# Patient Record
Sex: Male | Born: 1945 | Race: White | Hispanic: No | Smoking: Never smoker
Health system: Southern US, Community
[De-identification: ages and names within clinical notes are randomized; demographics above are authoritative.]

## PROBLEM LIST (undated history)

## (undated) DIAGNOSIS — I10 Essential (primary) hypertension: Secondary | ICD-10-CM

## (undated) DIAGNOSIS — I209 Angina pectoris, unspecified: Secondary | ICD-10-CM

## (undated) DIAGNOSIS — R0602 Shortness of breath: Secondary | ICD-10-CM

## (undated) DIAGNOSIS — Z8249 Family history of ischemic heart disease and other diseases of the circulatory system: Secondary | ICD-10-CM

## (undated) DIAGNOSIS — I252 Old myocardial infarction: Secondary | ICD-10-CM

## (undated) DIAGNOSIS — E781 Pure hyperglyceridemia: Secondary | ICD-10-CM

## (undated) DIAGNOSIS — I2510 Atherosclerotic heart disease of native coronary artery without angina pectoris: Secondary | ICD-10-CM

## (undated) HISTORY — PX: CAROTID ARTERY ANGIOPLASTY: SHX1300

## (undated) HISTORY — PX: CORONARY ANGIOPLASTY WITH STENT PLACEMENT: SHX49

---

## 2011-07-11 ENCOUNTER — Encounter (HOSPITAL_COMMUNITY): Payer: Self-pay | Admitting: Pharmacy Technician

## 2011-07-18 ENCOUNTER — Encounter (HOSPITAL_COMMUNITY): Payer: Self-pay | Admitting: Cardiology

## 2011-07-18 DIAGNOSIS — R9431 Abnormal electrocardiogram [ECG] [EKG]: Secondary | ICD-10-CM | POA: Diagnosis present

## 2011-07-18 DIAGNOSIS — I10 Essential (primary) hypertension: Secondary | ICD-10-CM | POA: Diagnosis present

## 2011-07-18 DIAGNOSIS — E781 Pure hyperglyceridemia: Secondary | ICD-10-CM

## 2011-07-18 DIAGNOSIS — I209 Angina pectoris, unspecified: Secondary | ICD-10-CM | POA: Diagnosis present

## 2011-07-18 DIAGNOSIS — Z8249 Family history of ischemic heart disease and other diseases of the circulatory system: Secondary | ICD-10-CM

## 2011-07-18 NOTE — H&P (Signed)
Taylor Lyons is an 66 y.o. male.    Chief Complaint: Chest pain.  HPI: HOPI: Patient presents for f/u of chest pain.  Chest pain started 4 months ago.  It occurs with exertion and intercourse.  Chest discomfort lasts 10-15 minutes.  It is described as tightness. It is located in the left region. It does not radiate.   It is relieved by rest or slowing pace down while exercising.  Chest discomfort is not associated with dyspnea, nausea, vomiting, diaphoresis, palpitations,  dizziness, or near syncope.   There is occasionally a nocternal component.    There has been ongoing concern for markedly abnormal stress EKG at low level workload.  Exercise Sestamibi 12/24/10: Anterolat scar with mod ischemia. EF 40%. High risk. No change 08/07/10 and concern about him being totally asymptomatic in spite of marked abnormal stress EKG during testing.   Mechanism of underlying disease process and action of medications discussed with the patient.   Discussed cardiac catheterization and risks and alternatives with the patient and he opted for medical therapy until now. Patient states he has had to restrict activity due to symptoms and has become fearful of a major cardiac event.  He is requesting cardiac cath with  intervention if necessary.  Past Medical History  Diagnosis Date  . Other and unspecified angina pectoris   . Essential hypertension, benign   . Pure hyperglyceridemia   . Family history of ischemic heart disease     No past surgical history on file.  Family History  Problem Relation Age of Onset  . Coronary artery disease Father 57    died with MI   Social History:  reports that he has never smoked. He has never used smokeless tobacco. His alcohol and drug histories not on file.  Allergies:  Allergies  Allergen Reactions  . Aspirin Hives  . Nsaids Rash     Height 68", Weight 175 Lbs, BP 140/90, RR 15    GENERAL: Well developed and nourished in NAD.    Alert and oriented  x 3.Appears  stated age.     There is no cyanosis.      HEENT: normal limits.      NECK: no JVD noted.      CARDIAC EXAM: S1 S2 normal, no gallop or murmur.      CHEST EXAM: No tenderness of chest wall.      LUNGS: Clear to percuss and auscultate.     No rales or ronchi.   Fibrotic breath sounds at bases.   NEUROLOGIC EXAM: Grossly intact without any focal deficits.      EXTREMITY: No edema.      VASCULAR EXAM: No skin breakdown.     Carotids normal.     Extremities: Femoral pulse normal.     No prominent pulse felt in the abdomen.     No varicose veins.  Assessment/Plan 1.  Abnormal EKG: 07/07/11: NSR, T wave abnormality in I, AvL, and mild ST depression in V6, suggestive of left ventricular hypertrophy with strain pattern verses ischemia.   No significant change from 07/05/10.   2.  Chest pain probably angina pectoris.  Exercise Sestamibi 12/24/10: Anterolat scar with mod ischemia. EF 42%. High risk. No change 08/07/10.  Echo 06/09/11: Normal LVEF 65%. Mild LVH, mild diastolic dysfunction. EF improved from 50% 05/25/2010.  3. Hypertension not at goal. BP 140/90 mm Hg.   4. Hyperlipidemia is @ goal, LDL 72 managed by [facility] [employer]. labs  12/15/10: TC 157, TG 192,   HDL 47, LDL 72  5.  Family history of premature  CAD.    Father died at age 57 of a MI after shoveling snow.  Plan:  Mechanism of underlying disease process and action of medications discussed with the patient.  Patient   Schedule for cardiac catheterization. We discussed regarding risks, benefits, alternatives to this including stress testing, CTA and continued medical therapy. Patient wants to proceed. Understands <1-2% risk of death, stroke, MI, urgent CABG, bleeding, infection, renal failure but not limited to these.   Will add NItroglycerin 0.4 mg SL prn chest pain, Coreg 6.25 mg BID and Pravachol 20 mg PO QHS.  Pt was reluctant to take any cardiac medications in the past and was following Or[employer] diet, however, with symptom progression, abnormal ECG and  Exercise Sestamibi, pt now agreeable.   S/L NTG was prescribed and explained how to and when to use it and to notify us if there is change in frequency of use. Interaction with cialis-like agents (if applicable was discussed).  Patient instructed to call if symptoms worse or to go to the ED for further evaluation.  Taylor Lyons,Taylor R, MD 07/18/2011, 7:57 PM     

## 2011-07-19 ENCOUNTER — Ambulatory Visit (HOSPITAL_COMMUNITY)
Admission: RE | Admit: 2011-07-19 | Discharge: 2011-07-19 | Disposition: A | Discharge status: Home / Self Care | Payer: 59 | Source: Ambulatory Visit | Attending: Cardiology | Admitting: Cardiology

## 2011-07-19 ENCOUNTER — Encounter (HOSPITAL_COMMUNITY)
Admission: RE | Disposition: A | Discharge status: Home / Self Care | Payer: Self-pay | Source: Ambulatory Visit | Attending: Cardiology

## 2011-07-19 DIAGNOSIS — R9431 Abnormal electrocardiogram [ECG] [EKG]: Secondary | ICD-10-CM | POA: Diagnosis present

## 2011-07-19 DIAGNOSIS — I2510 Atherosclerotic heart disease of native coronary artery without angina pectoris: Secondary | ICD-10-CM

## 2011-07-19 DIAGNOSIS — E781 Pure hyperglyceridemia: Secondary | ICD-10-CM

## 2011-07-19 DIAGNOSIS — I10 Essential (primary) hypertension: Secondary | ICD-10-CM | POA: Insufficient documentation

## 2011-07-19 DIAGNOSIS — I209 Angina pectoris, unspecified: Secondary | ICD-10-CM | POA: Diagnosis present

## 2011-07-19 DIAGNOSIS — Z8249 Family history of ischemic heart disease and other diseases of the circulatory system: Secondary | ICD-10-CM

## 2011-07-19 HISTORY — DX: Pure hyperglyceridemia: E78.1

## 2011-07-19 HISTORY — DX: Angina pectoris, unspecified: I20.9

## 2011-07-19 HISTORY — DX: Essential (primary) hypertension: I10

## 2011-07-19 HISTORY — DX: Family history of ischemic heart disease and other diseases of the circulatory system: Z82.49

## 2011-07-19 HISTORY — PX: LEFT HEART CATHETERIZATION WITH CORONARY ANGIOGRAM: SHX5451

## 2011-07-19 SURGERY — LEFT HEART CATHETERIZATION WITH CORONARY ANGIOGRAM
Anesthesia: LOCAL

## 2011-07-19 MED ORDER — ONDANSETRON HCL 4 MG/2ML IJ SOLN: 4.0000 mg | Freq: Four times a day (QID) | INTRAMUSCULAR | Status: DC | PRN

## 2011-07-19 MED ORDER — MIDAZOLAM HCL 2 MG/2ML IJ SOLN
INTRAMUSCULAR | Status: AC
  Filled 2011-07-19: qty 2

## 2011-07-19 MED ORDER — SODIUM CHLORIDE 0.9 % IV SOLN: 250.0000 mL | INTRAVENOUS | Status: DC | PRN

## 2011-07-19 MED ORDER — ACETAMINOPHEN 325 MG PO TABS: 650.0000 mg | ORAL_TABLET | ORAL | Status: DC | PRN

## 2011-07-19 MED ORDER — SODIUM CHLORIDE 0.9 % IV SOLN: INTRAVENOUS | Status: DC

## 2011-07-19 MED ORDER — HYDROMORPHONE HCL PF 2 MG/ML IJ SOLN
INTRAMUSCULAR | Status: AC
  Filled 2011-07-19: qty 1

## 2011-07-19 MED ORDER — SODIUM CHLORIDE 0.9 % IJ SOLN: 3.0000 mL | Freq: Two times a day (BID) | INTRAMUSCULAR | Status: DC

## 2011-07-19 MED ORDER — SODIUM CHLORIDE 0.9 % IV SOLN
INTRAVENOUS | Status: DC
  Administered 2011-07-19: 09:00:00 via INTRAVENOUS

## 2011-07-19 MED ORDER — ASPIRIN 81 MG PO CHEW
324.0000 mg | CHEWABLE_TABLET | ORAL | Status: AC
  Administered 2011-07-19: 324 mg via ORAL
  Filled 2011-07-19: qty 4

## 2011-07-19 MED ORDER — HEPARIN (PORCINE) IN NACL 2-0.9 UNIT/ML-% IJ SOLN
INTRAMUSCULAR | Status: AC
  Filled 2011-07-19: qty 2000

## 2011-07-19 MED ORDER — NITROGLYCERIN 0.2 MG/ML ON CALL CATH LAB
INTRAVENOUS | Status: AC
  Filled 2011-07-19: qty 1

## 2011-07-19 MED ORDER — SODIUM CHLORIDE 0.9 % IJ SOLN: 3.0000 mL | INTRAMUSCULAR | Status: DC | PRN

## 2011-07-19 MED ORDER — LIDOCAINE HCL (PF) 1 % IJ SOLN
INTRAMUSCULAR | Status: AC
  Filled 2011-07-19: qty 30

## 2011-07-19 MED ORDER — VERAPAMIL HCL 2.5 MG/ML IV SOLN
INTRAVENOUS | Status: AC
  Filled 2011-07-19: qty 2

## 2011-07-19 MED ORDER — HEPARIN SODIUM (PORCINE) 1000 UNIT/ML IJ SOLN
INTRAMUSCULAR | Status: AC
  Filled 2011-07-19: qty 1

## 2011-07-19 NOTE — Interval H&P Note (Signed)
History and Physical Interval Note:  07/19/2011 9:30 AM  Taylor Lyons  has presented today for surgery, with the diagnosis of Chest Pain  The various methods of treatment have been discussed with the patient and family. After consideration of risks, benefits and other options for treatment, the patient has consented to  Procedure(s) (LRB): LEFT HEART CATHETERIZATION WITH CORONARY ANGIOGRAM (N/A) as a surgical intervention .  The patients' history has been reviewed, patient examined, no change in status, stable for surgery.  I have reviewed the patients' chart and labs.  Questions were answered to the patient's satisfaction.     Chandrea Zellman,JAGADEESH R  No change in HPI since last OV

## 2011-07-19 NOTE — Cardiovascular Report (Signed)
NAMEKAYVEON, LENNARTZ NO.:  [id number]  MEDICAL RECORD NO.:  [id number]  LOCATION:  [facility]                         FACILITY:  [facility]  PHYSICIAN:  Pamella Pert, MD DATE OF BIRTH:  03/09/1946  DATE OF PROCEDURE:  07/19/2011 DATE OF DISCHARGE:  07/19/2011                           CARDIAC CATHETERIZATION   PROCEDURE PERFORMED:  Left heart catheterization including: 1. Left ventriculography. 2. Selective left and right coronary arteriography.  INDICATION:  Mr. Young Mulvey is a pleasant 66 year old gentleman with chronic stable angina.  He has hyperlipidemia, hypertension, and family history of premature coronary artery disease.  He has been having angina for the last 6-8 months and had a previously abnormal stress test revealing antral wall scar with antral wall severe ischemia.  But, he wanted medical therapy.  But because of worsening angina, he is now brought to the cardiac cath lab to evaluate his coronary anatomy.  ANGIOGRAPHIC DATA:  Right coronary artery:  Right coronary artery is a very large-caliber vessel and a dominant vessel.  It is pretty much super dominant with type 3 collaterals to the LAD and diagonal.  There is mild lumpy-bumpy stenosis noted in the right coronary artery, constituting 20-30%.  The PL branch of the right coronary artery is very large.  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  The left main has a 90-99% stenosis just after its origin in the mid segment at the aortic root level.  This is followed by mild diffuse coronary calcification.  LAD:  LAD is a moderate-caliber vessel with diffuse coronary calcification in the proximal segment.  In the mid segment, it has a 90% stenosis.  There is a origin of a very large diagonal 1, which is collateralized by the right coronary artery.  Circumflex coronary artery:  Circumflex coronary artery is a moderate- caliber  vessel giving origin to a small OM1 and a small-to-moderate sized OM 2.  There is mild luminal irregularity.  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  The LAD which is very large-caliber vessel has a mid segment 90% stenosis.  It gives origin to a very large diagonal 1, which is subtotally occluded and is collateralized by the right coronary artery.  The circumflex itself is a small vessel.  RECOMMENDATION:  Based on the coronary anatomy, he will benefit from coronary artery bypass grafting.  The question is the timing whether he can have it as an elective basis or as an in the inpatient evaluation. I will discuss this with my surgical colleagues.  His symptoms have been ongoing for several months; however, the high-grade nature of the left main coronary artery, it needs to be kept in mind.  At this point, I will put him as an outpatient procedure; however, after discussion with the surgeon final recommendations regarding disposition will be made.  Addendum: After review of the angiograms it was felt that the coronary arteries are him in about 4 percutaneous coronary revascularization.  Patient has excellent active collaterals from the right coronary  artery to the LAD and a very large diagonal.  Even stenting the left main would give him significant benefit off improved coronary flow.  I discussed with the patient regarding coronary angioplasty versus coronary artery bypass grafting.  Patient prefers to have percutaneous coronary intervention.  I extensively discussed with the patient regarding risks benefits and alternatives.  He fully comprehends and is willing to proceed.  I can't do this an elective fashion as he has protected left main from significant collaterals from the right coronary artery to the LAD and diagonal.  The circumflex: Artery is small.  I appreciate Dr.  Dorris Fetch reviewing the angiograms.  TECHNIQUE OF PROCEDURE:  Under sterile precautions, using a 6-French right radial access, a 5-French TIG # 4 catheter was advanced into the ascending aorta then into the left ventricle.  Left ventriculography was performed in the RAO projection.  Catheter pulled into the ascending aorta, right coronary was selectively engaged and angiography was performed.  Multiple catheters were utilized to engage left main coronary artery including Pig 4, AL 2, multipurpose, and eventually AR 2 catheter.  Angiography was performed and multiple views of angiograms were performed and the catheter was then pulled out of body over exchange length safety J-wire.  The patient tolerated the procedure well.  No immediate complications.  Hemostasis obtained by applying TR band.     Pamella Pert, MD     JRG/MEDQ  D:  07/19/2011  T:  07/19/2011  Job:  [id number]  cc:   Aida Puffer, MD

## 2011-07-19 NOTE — CV Procedure (Signed)
LM origin from Right coronary cusp. 99% stenosis mid segment at the aortic root level. Mid LAD 90% and large D1 subtotaled with Type III collaterals from super dominant RCA.  [id number] Dictation #

## 2011-07-19 NOTE — Discharge Instructions (Signed)
Radial Site Care Refer to this sheet in the next few weeks. These instructions provide you with information on caring for yourself after your procedure. Your caregiver may also give you more specific instructions. Your treatment has been planned according to current medical practices, but problems sometimes occur. Call your caregiver if you have any problems or questions after your procedure. HOME CARE INSTRUCTIONS  You may shower the day after the procedure.Remove the bandage (dressing) and gently wash the site with plain soap and water.Gently pat the site dry.   Do not apply powder or lotion to the site.   Do not submerge the affected site in water for 3 to 5 days.   Inspect the site at least twice daily.   Do not flex or bend the affected arm for 24 hours.   No lifting over 5 pounds (2.3 kg) for 5 days after your procedure.   Do not drive home if you are discharged the same day of the procedure. Have someone else drive you.   You may drive 24 hours after the procedure unless otherwise instructed by your caregiver.  What to expect:  Any bruising will usually fade within 1 to 2 weeks.   Blood that collects in the tissue (hematoma) may be painful to the touch. It should usually decrease in size and tenderness within 1 to 2 weeks.  SEEK IMMEDIATE MEDICAL CARE IF:  You have unusual pain at the radial site.   You have redness, warmth, swelling, or pain at the radial site.   You have drainage (other than a small amount of blood on the dressing).   You have chills.   You have a fever or persistent symptoms for more than 72 hours.   You have a fever and your symptoms suddenly get worse.   Your arm becomes pale, cool, tingly, or numb.   You have heavy bleeding from the site. Hold pressure on the site.  Document Released: 06/11/2010 Document Revised: 01/19/2011 Document Reviewed: 06/11/2010 [employer] Patient Information 2012 [employer]. 

## 2011-07-20 ENCOUNTER — Encounter (HOSPITAL_COMMUNITY): Payer: Self-pay | Admitting: Respiratory Therapy

## 2011-07-25 ENCOUNTER — Encounter (HOSPITAL_COMMUNITY): Payer: Self-pay | Admitting: General Practice

## 2011-07-25 ENCOUNTER — Encounter (HOSPITAL_COMMUNITY)
Admission: RE | Disposition: A | Discharge status: Home / Self Care | Payer: Self-pay | Source: Ambulatory Visit | Attending: Cardiology

## 2011-07-25 ENCOUNTER — Ambulatory Visit (HOSPITAL_COMMUNITY)
Admission: RE | Admit: 2011-07-25 | Discharge: 2011-07-26 | Disposition: A | Discharge status: Home / Self Care | Payer: 59 | Source: Ambulatory Visit | Attending: Cardiology | Admitting: Cardiology

## 2011-07-25 ENCOUNTER — Other Ambulatory Visit: Payer: Self-pay

## 2011-07-25 DIAGNOSIS — E785 Hyperlipidemia, unspecified: Secondary | ICD-10-CM | POA: Insufficient documentation

## 2011-07-25 DIAGNOSIS — I10 Essential (primary) hypertension: Secondary | ICD-10-CM | POA: Insufficient documentation

## 2011-07-25 DIAGNOSIS — I2510 Atherosclerotic heart disease of native coronary artery without angina pectoris: Secondary | ICD-10-CM | POA: Insufficient documentation

## 2011-07-25 DIAGNOSIS — I209 Angina pectoris, unspecified: Secondary | ICD-10-CM | POA: Insufficient documentation

## 2011-07-25 DIAGNOSIS — Z9861 Coronary angioplasty status: Secondary | ICD-10-CM

## 2011-07-25 HISTORY — PX: PERCUTANEOUS CORONARY STENT INTERVENTION (PCI-S): SHX5485

## 2011-07-25 HISTORY — DX: Old myocardial infarction: I25.2

## 2011-07-25 HISTORY — DX: Atherosclerotic heart disease of native coronary artery without angina pectoris: I25.10

## 2011-07-25 HISTORY — PX: CARDIAC CATHETERIZATION: SHX172

## 2011-07-25 SURGERY — PERCUTANEOUS CORONARY STENT INTERVENTION (PCI-S)
Anesthesia: LOCAL

## 2011-07-25 MED ORDER — ACETAMINOPHEN 325 MG PO TABS: 650.0000 mg | ORAL_TABLET | ORAL | Status: DC | PRN

## 2011-07-25 MED ORDER — LIDOCAINE HCL (PF) 1 % IJ SOLN
INTRAMUSCULAR | Status: AC
  Filled 2011-07-25: qty 30

## 2011-07-25 MED ORDER — SODIUM CHLORIDE 0.9 % IV SOLN
INTRAVENOUS | Status: DC
  Administered 2011-07-25: 07:00:00 via INTRAVENOUS

## 2011-07-25 MED ORDER — TICAGRELOR 90 MG PO TABS
ORAL_TABLET | ORAL | Status: AC
  Administered 2011-07-26: 90 mg via ORAL
  Filled 2011-07-25: qty 2

## 2011-07-25 MED ORDER — TICAGRELOR 90 MG PO TABS
90.0000 mg | ORAL_TABLET | Freq: Two times a day (BID) | ORAL | Status: DC
  Administered 2011-07-25 – 2011-07-26 (×2): 90 mg via ORAL
  Filled 2011-07-25 (×4): qty 1

## 2011-07-25 MED ORDER — MIDAZOLAM HCL 2 MG/2ML IJ SOLN
INTRAMUSCULAR | Status: AC
  Filled 2011-07-25: qty 2

## 2011-07-25 MED ORDER — ASPIRIN 325 MG PO TABS
325.0000 mg | ORAL_TABLET | Freq: Every day | ORAL | Status: DC
  Filled 2011-07-25 (×2): qty 1

## 2011-07-25 MED ORDER — ASPIRIN 81 MG PO TABS: 81.0000 mg | ORAL_TABLET | Freq: Every day | ORAL | Status: DC

## 2011-07-25 MED ORDER — SODIUM CHLORIDE 0.9 % IJ SOLN: 3.0000 mL | Freq: Two times a day (BID) | INTRAMUSCULAR | Status: DC

## 2011-07-25 MED ORDER — ASPIRIN 81 MG PO CHEW
324.0000 mg | CHEWABLE_TABLET | ORAL | Status: AC
  Administered 2011-07-25: 162 mg via ORAL
  Filled 2011-07-25: qty 4

## 2011-07-25 MED ORDER — HYDROMORPHONE HCL PF 2 MG/ML IJ SOLN
INTRAMUSCULAR | Status: AC
  Filled 2011-07-25: qty 1

## 2011-07-25 MED ORDER — BIVALIRUDIN 250 MG IV SOLR
INTRAVENOUS | Status: AC
  Filled 2011-07-25: qty 250

## 2011-07-25 MED ORDER — SODIUM CHLORIDE 0.9 % IV SOLN: INTRAVENOUS | Status: AC

## 2011-07-25 MED ORDER — SIMVASTATIN 10 MG PO TABS
10.0000 mg | ORAL_TABLET | Freq: Every day | ORAL | Status: DC
  Administered 2011-07-25: 10 mg via ORAL
  Filled 2011-07-25 (×2): qty 1

## 2011-07-25 MED ORDER — SIMVASTATIN 40 MG PO TABS: 40.0000 mg | ORAL_TABLET | Freq: Every day | ORAL | Status: DC

## 2011-07-25 MED ORDER — NITROGLYCERIN 0.2 MG/ML ON CALL CATH LAB
INTRAVENOUS | Status: AC
  Filled 2011-07-25: qty 1

## 2011-07-25 MED ORDER — BIVALIRUDIN 250 MG IV SOLR
1.7500 mg/kg | Freq: Once | INTRAVENOUS | Status: DC
Start: 2011-07-25 — End: 2011-07-25

## 2011-07-25 MED ORDER — HEPARIN (PORCINE) IN NACL 2-0.9 UNIT/ML-% IJ SOLN
INTRAMUSCULAR | Status: AC
  Filled 2011-07-25: qty 2000

## 2011-07-25 MED ORDER — CARVEDILOL 6.25 MG PO TABS
6.2500 mg | ORAL_TABLET | Freq: Two times a day (BID) | ORAL | Status: DC
  Administered 2011-07-25 – 2011-07-26 (×2): 6.25 mg via ORAL
  Filled 2011-07-25 (×4): qty 1

## 2011-07-25 MED ORDER — NITROGLYCERIN 0.4 MG SL SUBL: 0.4000 mg | SUBLINGUAL_TABLET | SUBLINGUAL | Status: DC | PRN

## 2011-07-25 MED ORDER — SODIUM CHLORIDE 0.9 % IV SOLN: 250.0000 mL | INTRAVENOUS | Status: DC | PRN

## 2011-07-25 MED ORDER — SODIUM CHLORIDE 0.9 % IJ SOLN: 3.0000 mL | INTRAMUSCULAR | Status: DC | PRN

## 2011-07-25 MED ORDER — ONDANSETRON HCL 4 MG/2ML IJ SOLN: 4.0000 mg | Freq: Four times a day (QID) | INTRAMUSCULAR | Status: DC | PRN

## 2011-07-25 NOTE — Plan of Care (Signed)
Problem: Phase II Progression Outcomes Goal: Pain controlled Outcome: Completed/Met Date Met:  07/25/11 Patient painfree Goal: OOB to chair per PCI oders Outcome: Completed/Met Date Met:  07/25/11 OOB and tolerating well, sitting up vascular site stable Goal: Vascular site scale level 0 - I Vascular Site Scale Level 0: No bruising/bleeding/hematoma Level I (Mild): Bruising/Ecchymosis, minimal bleeding/ooozing, palpable hematoma < 3 cm Level II (Moderate): Bleeding not affecting hemodynamic parameters, pseudoaneurysm, palpable hematoma > 3 cm Outcome: Completed/Met Date Met:  07/25/11 Level 0  Goal: No post PCI angina Outcome: Completed/Met Date Met:  07/25/11 No angina Goal: Distal pulses equal baseline assessment Outcome: Completed/Met Date Met:  07/25/11 Pulses present and palpable Goal: Discharge plan established Outcome: Completed/Met Date Met:  07/25/11 Plan to discharge tommorrow Goal: Tolerating diet Outcome: Completed/Met Date Met:  07/25/11 Breakfast and lunch tolerated Goal: Other Phase II Outcomes/Goals Outcome: Completed/Met Date Met:  07/25/11 No barriers noted. Patient states he needs no teaching regarding stents and denies questions Given booklet and stent card and instructed to read information and ask any questions that arise

## 2011-07-25 NOTE — Op Note (Signed)
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 Successful PTCA and stenting of LM with 3.5x22 mm Resolute DES. 99% to 0%. Timi 2 to 3 flow.

## 2011-07-25 NOTE — Cardiovascular Report (Signed)
NAMEDECKLAN, MAU NO.:  [id number]  MEDICAL RECORD NO.:  [id number]  LOCATION:  [facility]                         FACILITY:  [facility]  PHYSICIAN:  Pamella Pert, MD DATE OF BIRTH:  December 01, 1945  DATE OF PROCEDURE:  07/25/2011 DATE OF DISCHARGE:                           CARDIAC CATHETERIZATION   PROCEDURE PERFORMED: 1. Coronary arteriography. 2. Percutaneous transluminal coronary angioplasty and stenting of the anamolous (Origin from right coronary cusp)     left main coronary artery with implantation of a 3.5 x 22 mm drug-     eluting Resolute stent. 3. Right femoral arteriogram with closure of right femoral arterial     access with Perclose.  INDICATION:  Mr. Taylor Lyons is a pleasant 66 year old gentleman with history of hyperlipidemia, hypertension who has been having class 3 angina pectoris who has had a previous abnormal stress test.  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  This left main had a high-grade 99% stenosis.  It also had a subtotally occluded diagonal 1 which is a large branch.  However, there were type 3 collaterals noted especially to the diagonal branch.  Hence, it was felt that percutaneous coronary intervention would be optimal.  INTERVENTION DATA:  Successful PTCA and stenting of the left main coronary artery with implantation of a 3.5 x 22 mm drug-eluting Resolute stent.  This stent was deployed at 9 atmospheric pressure for 1 minute followed by postdilatation with a 3.5 x 15 mm [place] Sprinter at 14 atmospheric pressure for 30 seconds.  Overall stenosis was reduced from 99% to 0% with TIMI-2 flow improved to TIMI-3 flow at the end of the procedure.  RECOMMENDATION:  The patient should get significant benefit and relief of his angina.  I see significant antegrade flow through a large diagonal which  was subtotally occluded previously but had collaterals from the right coronary artery.  The LAD is diffusely diseased in the mid-to-distal segment, but flow clearly improved with angioplasty.  Unless he has recurrence of angina, then I will consider angioplasty of the subtotally occluded diagonal branch of the LAD.  Otherwise, continued medical therapy with aggressive risk modification is indicated.  The patient previously had opted for not taking any kind of medications as he wanted to try naturopathic medicines and diet.  I recommended that he start on beta-blockers along with statins and the patient is now amenable.  A total of 125 mL of contrast was utilized for diagnostic and intervention.  TECHNIQUE OF PROCEDURE:  Under sterile precautions using a 6-French right femoral arterial access, and 6-French AL1 catheter was utilized to engage left main coronary artery.  Using [place]ougar guidewire, I was able to cross the left main coronary artery with moderate amount of difficulty. Having crossed the left main coronary artery, I pre-dilated with a 2.0 x 15 mm Sprinter at 8 atmospheric pressure for 24 seconds and then at 14 atmospheric pressure for 64 seconds.  Following this, I took the drug- eluting stent over the wire and placed it in the left main coronary artery and under fluoroscopy guidance and  angiographic guidance.  The stent was deployed at 9 atmospheric pressure for 1 minute, followed by postdilatation with a 3.5 x 15 mm [place] Sprinter at 14 atmospheric pressure for 31 seconds, followed by repeat angiography.  Intracoronary nitroglycerin was also administered.  Excellent results were evident. The guidewire was withdrawn, and guide catheter disengaged and pulled out of the body.  Right femoral arteriogram was performed through the arterial access sheath and access was closed with Perclose with excellent hemostasis. The patient tolerated the procedure well.  No immediate  complications.     Pamella Pert, MD     JRG/MEDQ  D:  07/25/2011  T:  07/25/2011  Job:  [id number]  cc:   Aida Puffer, MD

## 2011-07-25 NOTE — H&P (View-Only) (Signed)
Taylor Lyons is an 66 y.o. male.    Chief Complaint: Chest pain.  HPI: HOPI: Patient presents for f/u of chest pain.  Chest pain started 4 months ago.  It occurs with exertion and intercourse.  Chest discomfort lasts 10-15 minutes.  It is described as tightness. It is located in the left region. It does not radiate.   It is relieved by rest or slowing pace down while exercising.  Chest discomfort is not associated with dyspnea, nausea, vomiting, diaphoresis, palpitations,  dizziness, or near syncope.   There is occasionally a nocternal component.    There has been ongoing concern for markedly abnormal stress EKG at low level workload.  Exercise Sestamibi 12/24/10: Anterolat scar with mod ischemia. EF 40%. High risk. No change 08/07/10 and concern about him being totally asymptomatic in spite of marked abnormal stress EKG during testing.   Mechanism of underlying disease process and action of medications discussed with the patient.   Discussed cardiac catheterization and risks and alternatives with the patient and he opted for medical therapy until now. Patient states he has had to restrict activity due to symptoms and has become fearful of a major cardiac event.  He is requesting cardiac cath with  intervention if necessary.  Past Medical History  Diagnosis Date  . Other and unspecified angina pectoris   . Essential hypertension, benign   . Pure hyperglyceridemia   . Family history of ischemic heart disease     No past surgical history on file.  Family History  Problem Relation Age of Onset  . Coronary artery disease Father 49    died with MI   Social History:  reports that he has never smoked. He has never used smokeless tobacco. His alcohol and drug histories not on file.  Allergies:  Allergies  Allergen Reactions  . Aspirin Hives  . Nsaids Rash     Height 68", Weight 175 Lbs, BP 140/90, RR 15    GENERAL: Well developed and nourished in NAD.    Alert and oriented  x 3.Appears  stated age.     There is no cyanosis.      HEENT: normal limits.      NECK: no JVD noted.      CARDIAC EXAM: S1 S2 normal, no gallop or murmur.      CHEST EXAM: No tenderness of chest wall.      LUNGS: Clear to percuss and auscultate.     No rales or ronchi.   Fibrotic breath sounds at bases.   NEUROLOGIC EXAM: Grossly intact without any focal deficits.      EXTREMITY: No edema.      VASCULAR EXAM: No skin breakdown.     Carotids normal.     Extremities: Femoral pulse normal.     No prominent pulse felt in the abdomen.     No varicose veins.  Assessment/Plan 1.  Abnormal EKG: 07/07/11: NSR, T wave abnormality in I, AvL, and mild ST depression in V6, suggestive of left ventricular hypertrophy with strain pattern verses ischemia.   No significant change from 07/05/10.   2.  Chest pain probably angina pectoris.  Exercise Sestamibi 12/24/10: Anterolat scar with mod ischemia. EF 42%. High risk. No change 08/07/10.  Echo 06/09/11: Normal LVEF 65%. Mild LVH, mild diastolic dysfunction. EF improved from 50% 05/25/2010.  3. Hypertension not at goal. BP 140/90 mm Hg.   4. Hyperlipidemia is @ goal, LDL 72 managed by [facility] [employer]. labs  12/15/10: TC 157, TG 192,  HDL 47, LDL 72  5.  Family history of premature  CAD.    Father died at age 44 of a MI after shoveling snow.  Plan:  Mechanism of underlying disease process and action of medications discussed with the patient.  Patient   Schedule for cardiac catheterization. We discussed regarding risks, benefits, alternatives to this including stress testing, CTA and continued medical therapy. Patient wants to proceed. Understands <1-2% risk of death, stroke, MI, urgent CABG, bleeding, infection, renal failure but not limited to these.   Will add NItroglycerin 0.4 mg SL prn chest pain, Coreg 6.25 mg BID and Pravachol 20 mg PO QHS.  Pt was reluctant to take any cardiac medications in the past and was following [employer] diet, however, with symptom progression, abnormal ECG and  Exercise Sestamibi, pt now agreeable.   S/L NTG was prescribed and explained how to and when to use it and to notify us if there is change in frequency of use. Interaction with cialis-like agents (if applicable was discussed).  Patient instructed to call if symptoms worse or to go to the ED for further evaluation.  Pamella Pert, MD 07/18/2011, 7:57 PM

## 2011-07-25 NOTE — Plan of Care (Signed)
Problem: Phase III Progression Outcomes Goal: Vascular site scale level 0 - I Vascular Site Scale Level 0: No bruising/bleeding/hematoma Level I (Mild): Bruising/Ecchymosis, minimal bleeding/ooozing, palpable hematoma < 3 cm Level II (Moderate): Bleeding not affecting hemodynamic parameters, pseudoaneurysm, palpable hematoma > 3 cm  Outcome: Completed/Met Date Met:  07/25/11 Level 0     

## 2011-07-25 NOTE — Interval H&P Note (Signed)
History and Physical Interval Note:  07/25/2011 7:33 AM  Taylor Lyons  has presented today for surgery, with the diagnosis of CAD  The various methods of treatment have been discussed with the patient and family. After consideration of risks, benefits and other options for treatment, the patient has consented to  Procedure(s) (LRB): PERCUTANEOUS CORONARY STENT INTERVENTION (PCI-S) (N/A) as a surgical intervention .  The patients' history has been reviewed, patient examined, no change in status, stable for surgery.  I have reviewed the patients' chart and labs.  Questions were answered to the patient's satisfaction.     [name],JAGADEESH R  No change in HPI. Diagnostic coronary angio revealed CAD conducive for PCI. All questions answered.

## 2011-07-26 ENCOUNTER — Ambulatory Visit (HOSPITAL_COMMUNITY): Admit: 2011-07-26 | Payer: 59 | Admitting: Cardiology

## 2011-07-26 ENCOUNTER — Encounter (HOSPITAL_COMMUNITY): Payer: Self-pay

## 2011-07-26 ENCOUNTER — Other Ambulatory Visit: Payer: Self-pay

## 2011-07-26 LAB — BASIC METABOLIC PANEL
BUN: 9 mg/dL (ref 6–23)
Calcium: 9.5 mg/dL (ref 8.4–10.5)
GFR calc non Af Amer: >90 mL/min (ref >90)
Glucose, Bld: 103 mg/dL — ABNORMAL HIGH (ref 70–99)

## 2011-07-26 LAB — CBC
HCT: 42.1 % (ref 39.0–52.0)
Hemoglobin: 14.5 g/dL (ref 13.0–17.0)
MCH: 35 pg — ABNORMAL HIGH (ref 26.0–34.0)
MCHC: 34.4 g/dL (ref 30.0–36.0)
MCV: 101.7 fL — ABNORMAL HIGH (ref 78.0–100.0)
Platelets: 235 K/uL (ref 150–400)
RBC: 4.14 MIL/uL — ABNORMAL LOW (ref 4.22–5.81)
RDW: 12 % (ref 11.5–15.5)
WBC: 10 K/uL (ref 4.0–10.5)

## 2011-07-26 SURGERY — PERCUTANEOUS CORONARY STENT INTERVENTION (PCI-S)
Anesthesia: LOCAL

## 2011-07-26 MED ORDER — ASPIRIN 81 MG PO CHEW: 81.0000 mg | CHEWABLE_TABLET | Freq: Every day | ORAL | Status: AC

## 2011-07-26 MED ORDER — ASPIRIN 81 MG PO CHEW
81.0000 mg | CHEWABLE_TABLET | Freq: Every day | ORAL | Status: DC
  Administered 2011-07-26: 81 mg via ORAL
  Filled 2011-07-26: qty 1

## 2011-07-26 MED ORDER — ASPIRIN 81 MG PO TABS: 81.0000 mg | ORAL_TABLET | Freq: Every day | ORAL | Status: DC

## 2011-07-26 MED ORDER — TICAGRELOR 90 MG PO TABS: 90.0000 mg | ORAL_TABLET | Freq: Two times a day (BID) | ORAL | Status: DC

## 2011-07-26 NOTE — Progress Notes (Signed)
CARDIAC REHAB PHASE I   PRE:  Rate/Rhythm: 79SR  BP:  Supine: 129/79  Sitting:   Standing:    SaO2:   MODE:  Ambulation: 680 ft   POST:  Rate/Rhythem: 88SR  BP:  Supine:   Sitting: 137/80  Standing:    SaO2:  [id number] Pt walked 680 ft on RA with steady gait. Tolerated well. Denied chest pain. Education completed with pt. Pt following ornish diet and exercising. Does not want to do Phase 2 since he is watching diet and ex.  Taylor Lyons

## 2011-07-26 NOTE — Discharge Summary (Signed)
Physician Discharge Summary  Patient ID: Taylor Lyons MRN: [id number] DOB/AGE: [birth year] 67 y.o.  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  S/P PTCA and stent to LM 07/25/11 3.5x22 Resolute DES 07/25/11. Has residual subtotalled large D1 stenosis, but has type III collaterals from RCA.  Secondary Discharge Diagnosis Hyperlipidemia Family history or premature CAD. Father died at 49 years of age with MI  Significant Diagnostic Studies: Coronary intervention in a elective fashion to 99% stenosis of left main coronary artery.   Consults:   Hospital Course:    Discharge Exam: Blood pressure 112/70, pulse 65, temperature 98.1 F (36.7 C), temperature source Oral, resp. rate 20, height 5\' 7"  (1.702 m), weight 80.2 kg (176 lb 12.9 oz), SpO2 99.00%.    General appearance: alert, appears stated age and no distress Resp: clear to auscultation bilaterally Cardio: regular rate and rhythm, S1, S2 normal, no murmur, click, rub or gallop Extremities: extremities normal, atraumatic, no cyanosis or edema Right groin access site has healed well without hematoma.  Labs:   Lab Results  Component Value Date   WBC 10.0 07/26/2011   HGB 14.5 07/26/2011   HCT 42.1 07/26/2011   MCV 101.7* 07/26/2011   PLT 235 07/26/2011    Lab 07/26/11 0429  NA 139  K 4.0  CL 106  CO2 27  BUN 9  CREATININE 0.63  CALCIUM 9.5  PROT --  BILITOT --  ALKPHOS --  ALT --  AST --  GLUCOSE 103*     EKG:NSR/S. Bradycardia, LVH, Non specific T change.  FOLLOW UP PLANS AND APPOINTMENTS Yates Decamp, MD in 2 weeks appointment already set.   Medication List  As of 07/26/2011  8:34 AM   TAKE these medications         acetaminophen 325 MG tablet   Commonly known as: TYLENOL   Take 325 mg by mouth every 6 (six) hours as needed. For headache      aspirin 81 MG tablet   Take 324 mg by mouth daily.      carvedilol  6.25 MG tablet   Commonly known as: COREG   Take 6.25 mg by mouth 2 (two) times daily with a meal.      fish oil-omega-3 fatty acids 1000 MG capsule   Take 1 g by mouth daily.      L-Arginine 500 MG Tabs   Take 1,000 mg by mouth daily.      nitroGLYCERIN 0.4 MG SL tablet   Commonly known as: NITROSTAT   Place 0.4 mg under the tongue every 5 (five) minutes as needed. For chest pain      pravastatin 20 MG tablet   Commonly known as: PRAVACHOL   Take 40 mg by mouth at bedtime.      sildenafil 100 MG tablet   Commonly known as: VIAGRA   Take 100 mg by mouth daily as needed.      Ticagrelor 90 MG Tabs tablet   Commonly known as: BRILINTA   Take 1 tablet (90 mg total) by mouth 2 (two) times daily.      Vitamin D 2000 UNITS tablet   Take 4,000 Units by mouth daily.              Pamella Pert, MD 07/26/2011, 8:34 AM

## 2011-07-26 NOTE — Progress Notes (Signed)
   CARE MANAGEMENT NOTE 07/26/2011  Patient:  TALHA, ISER   Account Number:  [id number]  Date Initiated:  07/26/2011  Documentation initiated by:  GRAVES-BIGELOW,Elani Delph  Subjective/Objective Assessment:   Pt admitted with Chest pain. S/p cath. Pt d/c on brilinta. CM had done a benefits check for medicaiton assistance and co pay was 20.00 for 30 day supply and 50.00 for 90 day supply.     Action/Plan:   Pt was d/c before CM was able to see pt and give him a 30 day free card. CM did call pt to make him aware of co pay cost. Unsure of pharmacy pt uses- hopefully pt will be able to get medications at d/c.   Anticipated DC Date:  07/26/2011   Anticipated DC Plan:  HOME/SELF CARE      DC Planning Services  CM consult      Choice offered to / List presented to:             Status of service:  Completed, signed off Medicare Important Message given?   (If response is "NO", the following Medicare IM given date fields will be blank) Date Medicare IM given:   Date Additional Medicare IM given:    Discharge Disposition:  HOME/SELF CARE  Per UR Regulation:    Comments:  07-26-11 [address] RN,BSN [phone] CM did make RN Nerrie aware that pt did not receive 30 day free card from CM. RN stated that pt receviced card from cath lab.

## 2012-02-03 ENCOUNTER — Ambulatory Visit
Admission: RE | Admit: 2012-02-03 | Discharge: 2012-02-03 | Disposition: A | Discharge status: Home / Self Care | Payer: 59 | Source: Ambulatory Visit | Attending: Cardiology | Admitting: Cardiology

## 2012-02-03 ENCOUNTER — Other Ambulatory Visit: Payer: Self-pay | Admitting: Cardiology

## 2012-02-03 DIAGNOSIS — J8410 Pulmonary fibrosis, unspecified: Secondary | ICD-10-CM

## 2012-02-03 DIAGNOSIS — R0602 Shortness of breath: Secondary | ICD-10-CM

## 2012-02-28 ENCOUNTER — Encounter (HOSPITAL_COMMUNITY): Payer: Self-pay

## 2012-02-28 ENCOUNTER — Ambulatory Visit (HOSPITAL_COMMUNITY): Admit: 2012-02-28 | Payer: Self-pay | Admitting: Cardiology

## 2012-02-28 SURGERY — LEFT HEART CATHETERIZATION WITH CORONARY ANGIOGRAM
Anesthesia: LOCAL

## 2012-03-01 ENCOUNTER — Ambulatory Visit (INDEPENDENT_AMBULATORY_CARE_PROVIDER_SITE_OTHER): Payer: 59 | Admitting: Pulmonary Disease

## 2012-03-01 ENCOUNTER — Encounter: Payer: Self-pay | Admitting: Pulmonary Disease

## 2012-03-01 VITALS — BP 122/72 | HR 66 | Temp 98.0°F | Ht 68.0 in | Wt 180.8 lb

## 2012-03-01 DIAGNOSIS — J8410 Pulmonary fibrosis, unspecified: Secondary | ICD-10-CM

## 2012-03-01 DIAGNOSIS — J849 Interstitial pulmonary disease, unspecified: Secondary | ICD-10-CM | POA: Insufficient documentation

## 2012-03-01 NOTE — Patient Instructions (Addendum)
Will set up for ct chest to evaluate your scarring.  Will call you with results.

## 2012-03-01 NOTE — Assessment & Plan Note (Signed)
The patient has mild scarring noted on his recent chest x-ray, and clearly has crackles on exam.  He gives no history to suggest an autoimmune disease, has never been on the medications that classically cause interstitial disease, and does not have any significant exposures to substances which cause interstitial disease.  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  At this point, I would like to schedule for a high-resolution CT chest, and we'll make further decisions based on the results.

## 2012-03-01 NOTE — Progress Notes (Signed)
  Subjective:    Patient ID: Taylor Lyons, male    DOB: August 29, 1945, 66 y.o.   MRN: [id number]  HPI The patient is a 66 year old male who been asked to see for evaluation of an abnormal chest x-ray.  The patient recently had his yearly physical, and basilar crackles were heard on exam.  He was ultimately sent to cardiology for evaluation, and a chest x-ray showed basilar scarring of unknown origin.  The patient is a very active person, and walks daily at least 1 mile with hills.  He is satisfied with his exertional tolerance at this time.  He has a mild morning cough that is not significant.  He denies any lower extremity edema.  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  He denies any family history of autoimmune disease, and has no arthritis symptoms or major skin changes.  He has no swallowing issues or aspiration symptoms.  He does do woodworking, but does not use Engineer, drilling.  He has worked with toluene, but I explained to him this is primarily an issue for airways disease.  He denies ever being on amiodarone or Macrodantin.   Review of Systems  Constitutional: Negative for fever and unexpected weight change.  HENT: Negative for ear pain, nosebleeds, congestion, sore throat, rhinorrhea, sneezing, trouble swallowing, dental problem, postnasal drip and sinus pressure.   Eyes: Negative for redness and itching.  Respiratory: Positive for shortness of breath ( upon walking a hill) and wheezing. Negative for cough and chest tightness.   Cardiovascular: Negative for palpitations and leg swelling.  Gastrointestinal: Negative for nausea and vomiting.  Genitourinary: Negative for dysuria.  Musculoskeletal: Negative for joint swelling.  Skin: Negative for rash.  Neurological: Negative for headaches.  Hematological: Bruises/bleeds easily.  Psychiatric/Behavioral:  Negative for dysphoric mood. The patient is not nervous/anxious.        Objective:   Physical Exam Constitutional:  Well developed, no acute distress  HENT:  Nares patent without discharge  Oropharynx without exudate, palate and uvula are normal  Eyes:  Perrla, eomi, no scleral icterus  Neck:  No JVD, no TMG  Cardiovascular:  Normal rate, regular rhythm, no rubs or gallops.  2/6 sem        Intact distal pulses  Pulmonary :  Normal breath sounds, no stridor or respiratory distress   No rhonchi, or wheezing.  +bibasilar rales right >>left  Abdominal:  Soft, nondistended, bowel sounds present.  No tenderness noted.   Musculoskeletal:  No lower extremity edema noted.  Lymph Nodes:  No cervical lymphadenopathy noted  Skin:  No cyanosis noted  Neurologic:  Alert, appropriate, moves all 4 extremities without obvious deficit.         Assessment & Plan:

## 2012-03-05 ENCOUNTER — Encounter (HOSPITAL_COMMUNITY): Payer: Self-pay | Admitting: Respiratory Therapy

## 2012-03-08 ENCOUNTER — Ambulatory Visit (INDEPENDENT_AMBULATORY_CARE_PROVIDER_SITE_OTHER)
Admission: RE | Admit: 2012-03-08 | Discharge: 2012-03-08 | Disposition: A | Discharge status: Home / Self Care | Payer: 59 | Source: Ambulatory Visit | Attending: Pulmonary Disease | Admitting: Pulmonary Disease

## 2012-03-08 DIAGNOSIS — J849 Interstitial pulmonary disease, unspecified: Secondary | ICD-10-CM

## 2012-03-08 DIAGNOSIS — J8410 Pulmonary fibrosis, unspecified: Secondary | ICD-10-CM

## 2012-03-13 ENCOUNTER — Ambulatory Visit (HOSPITAL_COMMUNITY)
Admission: RE | Admit: 2012-03-13 | Discharge: 2012-03-14 | Disposition: A | Discharge status: Home / Self Care | Payer: 59 | Source: Ambulatory Visit | Attending: Cardiology | Admitting: Cardiology

## 2012-03-13 ENCOUNTER — Encounter (HOSPITAL_COMMUNITY)
Admission: RE | Disposition: A | Discharge status: Home / Self Care | Payer: Self-pay | Source: Ambulatory Visit | Attending: Cardiology

## 2012-03-13 ENCOUNTER — Encounter (HOSPITAL_COMMUNITY): Payer: Self-pay | Admitting: General Practice

## 2012-03-13 DIAGNOSIS — E7800 Pure hypercholesterolemia, unspecified: Secondary | ICD-10-CM | POA: Insufficient documentation

## 2012-03-13 DIAGNOSIS — Y831 Surgical operation with implant of artificial internal device as the cause of abnormal reaction of the patient, or of later complication, without mention of misadventure at the time of the procedure: Secondary | ICD-10-CM | POA: Insufficient documentation

## 2012-03-13 DIAGNOSIS — I10 Essential (primary) hypertension: Secondary | ICD-10-CM | POA: Insufficient documentation

## 2012-03-13 DIAGNOSIS — I209 Angina pectoris, unspecified: Secondary | ICD-10-CM | POA: Insufficient documentation

## 2012-03-13 DIAGNOSIS — R0602 Shortness of breath: Secondary | ICD-10-CM | POA: Insufficient documentation

## 2012-03-13 DIAGNOSIS — Z9861 Coronary angioplasty status: Secondary | ICD-10-CM | POA: Insufficient documentation

## 2012-03-13 DIAGNOSIS — Z8249 Family history of ischemic heart disease and other diseases of the circulatory system: Secondary | ICD-10-CM | POA: Insufficient documentation

## 2012-03-13 DIAGNOSIS — I2510 Atherosclerotic heart disease of native coronary artery without angina pectoris: Secondary | ICD-10-CM | POA: Insufficient documentation

## 2012-03-13 DIAGNOSIS — Z7982 Long term (current) use of aspirin: Secondary | ICD-10-CM | POA: Insufficient documentation

## 2012-03-13 DIAGNOSIS — T82897A Other specified complication of cardiac prosthetic devices, implants and grafts, initial encounter: Secondary | ICD-10-CM | POA: Insufficient documentation

## 2012-03-13 HISTORY — PX: LEFT HEART CATHETERIZATION WITH CORONARY ANGIOGRAM: SHX5451

## 2012-03-13 HISTORY — PX: PERCUTANEOUS CORONARY STENT INTERVENTION (PCI-S): SHX5485

## 2012-03-13 HISTORY — DX: Shortness of breath: R06.02

## 2012-03-13 LAB — POCT ACTIVATED CLOTTING TIME: Activated Clotting Time: 349 seconds

## 2012-03-13 SURGERY — LEFT HEART CATHETERIZATION WITH CORONARY ANGIOGRAM
Anesthesia: LOCAL | Laterality: Right

## 2012-03-13 MED ORDER — ASPIRIN 81 MG PO CHEW
81.0000 mg | CHEWABLE_TABLET | Freq: Every day | ORAL | Status: DC
  Filled 2012-03-13: qty 1

## 2012-03-13 MED ORDER — ACETAMINOPHEN 325 MG PO TABS
ORAL_TABLET | ORAL | Status: AC
  Filled 2012-03-13: qty 4

## 2012-03-13 MED ORDER — HYDROMORPHONE HCL PF 2 MG/ML IJ SOLN
INTRAMUSCULAR | Status: AC
  Filled 2012-03-13: qty 1

## 2012-03-13 MED ORDER — SODIUM CHLORIDE 0.9 % IJ SOLN
3.0000 mL | INTRAMUSCULAR | Status: DC | PRN
  Administered 2012-03-13: 18:00:00 3 mL via INTRAVENOUS

## 2012-03-13 MED ORDER — TICAGRELOR 90 MG PO TABS
90.0000 mg | ORAL_TABLET | Freq: Two times a day (BID) | ORAL | Status: DC
  Administered 2012-03-13: 21:00:00 90 mg via ORAL
  Filled 2012-03-13 (×3): qty 1

## 2012-03-13 MED ORDER — SODIUM CHLORIDE 0.9 % IV SOLN
INTRAVENOUS | Status: DC
  Administered 2012-03-13: 1000 mL via INTRAVENOUS

## 2012-03-13 MED ORDER — SODIUM CHLORIDE 0.9 % IV SOLN
1.0000 mL/kg/h | INTRAVENOUS | Status: AC
  Administered 2012-03-13: 1 mL/kg/h via INTRAVENOUS

## 2012-03-13 MED ORDER — SODIUM CHLORIDE 0.9 % IJ SOLN: 3.0000 mL | INTRAMUSCULAR | Status: DC | PRN

## 2012-03-13 MED ORDER — ACETAMINOPHEN 325 MG PO TABS: 650.0000 mg | ORAL_TABLET | ORAL | Status: DC | PRN

## 2012-03-13 MED ORDER — CARVEDILOL 6.25 MG PO TABS
6.2500 mg | ORAL_TABLET | Freq: Two times a day (BID) | ORAL | Status: DC
  Administered 2012-03-13: 17:00:00 6.25 mg via ORAL
  Filled 2012-03-13 (×4): qty 1

## 2012-03-13 MED ORDER — LIDOCAINE HCL (PF) 1 % IJ SOLN
INTRAMUSCULAR | Status: AC
  Filled 2012-03-13: qty 30

## 2012-03-13 MED ORDER — ASPIRIN 81 MG PO CHEW
324.0000 mg | CHEWABLE_TABLET | ORAL | Status: AC
  Administered 2012-03-13 (×2): 324 mg via ORAL

## 2012-03-13 MED ORDER — SODIUM CHLORIDE 0.9 % IV SOLN: 250.0000 mL | INTRAVENOUS | Status: DC

## 2012-03-13 MED ORDER — MIDAZOLAM HCL 2 MG/2ML IJ SOLN
INTRAMUSCULAR | Status: AC
  Filled 2012-03-13: qty 2

## 2012-03-13 MED ORDER — SIMVASTATIN 40 MG PO TABS
40.0000 mg | ORAL_TABLET | Freq: Every day | ORAL | Status: DC
  Administered 2012-03-13: 40 mg via ORAL
  Filled 2012-03-13 (×2): qty 1

## 2012-03-13 MED ORDER — NITROGLYCERIN 0.4 MG SL SUBL: 0.4000 mg | SUBLINGUAL_TABLET | SUBLINGUAL | Status: DC | PRN

## 2012-03-13 MED ORDER — BIVALIRUDIN 250 MG IV SOLR
INTRAVENOUS | Status: AC
  Filled 2012-03-13: qty 250

## 2012-03-13 MED ORDER — SODIUM CHLORIDE 0.9 % IJ SOLN: 3.0000 mL | Freq: Two times a day (BID) | INTRAMUSCULAR | Status: DC

## 2012-03-13 MED ORDER — SODIUM CHLORIDE 0.9 % IV SOLN: 250.0000 mL | INTRAVENOUS | Status: DC | PRN

## 2012-03-13 MED ORDER — NITROGLYCERIN 0.2 MG/ML ON CALL CATH LAB
INTRAVENOUS | Status: AC
  Filled 2012-03-13: qty 1

## 2012-03-13 MED ORDER — ASPIRIN 81 MG PO CHEW
CHEWABLE_TABLET | ORAL | Status: AC
  Filled 2012-03-13: qty 4

## 2012-03-13 MED ORDER — ONDANSETRON HCL 4 MG/2ML IJ SOLN: 4.0000 mg | Freq: Four times a day (QID) | INTRAMUSCULAR | Status: DC | PRN

## 2012-03-13 NOTE — CV Procedure (Addendum)
Procedure performed:  Left heart catheterization including hemodynamic monitoring of the left ventricle, LV gram, selective right and left coronary arteriography. Right femoral arteriogram and closure of right femoral arterial access with Mynx device  Indication patient is a 66 year-old male with history of hypertension (), hyperlipidemia (),  who presents with dyspnea and fatigue. Patient has  had PTCA and stenting to anamolous LM in March 2013 with implantation of a 3.5 x 22 mm resolute drug-eluting stent.  non invasive testing which was markedly abnormal showing anterolateral ischemia.  Hence is brought to the cardiac catheterization lab to evaluate his coronary anatomy.  Hemodynamic data: Left ventricular pressure was 118/6 with LVEDP of 9 mm mercury. Aortic pressure was 123/73 with a mean of 95 mm mercury. There was no pressure gradient across the aortic valve.   Left ventricle: Performed in the LAO and RAO projection revealed LVEF of 50% with mild global hypokinesis. There was no MR. No focal wall motion abnormality.  Right coronary artery: The vessel is smooth, normal, very large and Dominant. RCA a super dominant. Gives origin to a very large PDA and PL branch. The PDA branch of the ostium has a 20-30% stenosis in the PL branch has diffuse 20-30% luminal irregularity in the proximal segment. Gives origin to a 3 collaterals to the diagonal 2.  Left main coronary artery is large and has anomalous origin from the right coronary cusp. Previously placed 3.5 x 22 mm resolute stent in April of 2013, has proximal focal instent restenosis of 80%.  Circumflex coronary artery: A moderate caliber vessel giving origin to a small obtuse marginal 1. It is mild diffuse disease.  LAD:  LAD gives origin to a moderate diagonal-1, and a very large D2 which has a proximal 99% stenosis. Type III collaterals to the diagonal 2 is evident from the right coronary artery.  LAD has diffuse severe luminal  irregularities. Midsegment of the LAD had a focal 80% stenosis.  Impression: Instent restenosis of the left main stent, subtotally occluded D2. Mid LAD with a 80% stenosis.  Interventional data: Successful PTCA and cutting balloon angioplasty of the left main stent with a 3.5 x 6 mm cutting balloon reduction of stenosis from 80% to 0% with brisk TIMI-3 to TIMI-3 flow maintained.  Successful PTCA and scoring balloon angioplasty of D2 with 2.5 x 10 mm balloon reduction of stenosis from 99% to less than 10-20% with improvement in TIMI 2 flow to TIMI-3 flow. Is proximal haziness noted, however there is no evidence of dissection. Lesion was calcified. Successful PTCA and scoring balloon angioplasty of the mid LAD with a 2.5 x 10 mm angiosculpt balloon. 80% stenosis reduced to 0% with brisk TIMI-3 to TIMI-3 flow maintained at the end of the procedure.   Technique of diagnostic cardiac catheterization:  Under sterile precautions using a 6 French right femoral  arterial access, a 5 French sheath was introduced into the right radial artery. A JR 4 diagnostic catheter was utilized to engage the left main coronary artery and also the right coronary artery and angiography was performed. Same cath was utilized to perform left the program in both LAO and RAO projection.  The catheter was pulled back Out of the body over exchange length J-wire.   Technique of intervention:  Using a 6 [place] JR 4 guide catheter with SH the anomalous left maincoronary  was selected and cannulated. Using Angiomax for anticoagulation, I utilized a a BMW guidewire and across the  left main coronary artery without  any difficulty. I placed the tip of the wire into the distal  coronary artery. Angiography was performed.   Then I utilized a 3.5 x 6 mm cutting balloon , I performed balloon angioplasty at  peak of 12 atmospheric pressure x for for  60-90 second each. I then attention to the D2 and using the same guidewire with moderate  amount of difficulty is able to cross the stenosis. I then tried to use endosculpt balloon, unable to pass it in spite of gentle proximal inflation of the same balloon. Hence I pulled it out of the body and used a 2.0 x 15 mm mini trek balloon and balloon and plasty was performed at 10 atmospheric pressure x2 for 50 seconds each. Then I used the 2.5 x 15 mm angiosculpt balloon I performed balloon angioplasty at 6 atmospheric pressure from 30-60 seconds for 6 times. Angiography was performed. Then I utilized the same balloon and the wire to perform balloon angioplasty to the midsegment of the LAD as it appeared to be much more significant in the LAO cranial views. The guidewire was withdrawn out of the D2 and directed into the LAD.  Post-balloon and plasty results were excellent. Right femoral arterial access angiogram was performed through the arterial access sheath and access closed with Minx device with excellent hemostasis. Patient will be discharged home in the morning. It perm 25 cc of contrast was utilized for diagnostic and interventional procedure.

## 2012-03-13 NOTE — Interval H&P Note (Signed)
History and Physical Interval Note:  03/13/2012 7:42 AM  Taylor Lyons  has presented today for surgery, with the diagnosis of shortness of breath/cad  The various methods of treatment have been discussed with the patient and family. After consideration of risks, benefits and other options for treatment, the patient has consented to  Procedure(s) (LRB) with comments: LEFT HEART CATHETERIZATION WITH CORONARY ANGIOGRAM (N/A) and possible angioplasty  as a surgical intervention .  The patient's history has been reviewed, patient examined, no change in status, stable for surgery.  I have reviewed the patient's chart and labs.  Questions were answered to the patient's satisfaction.     Pamella Pert

## 2012-03-13 NOTE — Discharge Summary (Signed)
Physician Discharge Summary  Patient ID: Markavion Chrisler MRN: [id number] DOB/AGE: 11/10/1945 66 y.o.  Admit date: 03/13/2012 Discharge date: 03/13/2012  Primary Discharge Diagnosis CAD S/P PTCA and stenting Secondary Discharge Diagnosis Hypertlipidemia Hypertension. Pulmonary fibrosis   Hospital Course: Patient is a 66 year-old male with history of hypertension (), hyperlipidemia (),  who presents with dyspnea and fatigue. Patient has  had PTCA and stenting to anamolous LM in March 2013 with implantation of a 3.5 x 22 mm resolute drug-eluting stent.  non invasive testing which was markedly abnormal showing anterolateral ischemia. Patient underwent cardiac cath 03/13/12:  Impression: Instent restenosis of the left main stent, subtotally occluded D2. Mid LAD with a 80% stenosis.  Interventional data:  1. Successful PTCA and cutting balloon angioplasty of the left main stent with a 3.5 x 6 mm cutting balloon reduction of stenosis from 80% to 0% 2. Successful PTCA and scoring balloon angioplasty of D2 with 2.5 x 10 mm balloon  3. Successful PTCA and scoring balloon angioplasty of the mid LAD with a 2.5 x 10 mm angiosculpt balloon.  This morning patient feels well and denies any chest pain, shortness of breath, PND or orthopnea.  He has not had any significant arrhythmias on telemetry.  His physical examination was unremarkable and his right groin site has healed well.  Has felt stable for discharge.   Recommendations on discharge: Patient will continue with the well antiplatelet therapy for this period of years.  He is pretty severe and LAD.  The patient has been reluctant in taking statin therapy, and continue to treat her compliance for  Discharge Exam: Blood pressure 125/72, pulse 79, temperature 97.7 F (36.5 C), temperature source Oral, resp. rate 20, height 5\' 8"  (1.727 m), weight 81.1 kg (178 lb 12.7 oz), SpO2 96.00%.   General appearance: alert, cooperative, appears stated age  and no distress Resp: wheezes posterior - bilateral Cardio: regular rate and rhythm, S1, S2 normal, no murmur, click, rub or gallop GI: soft, non-tender; bowel sounds normal; no masses,  no organomegaly Extremities: extremities normal, atraumatic, no cyanosis or edema Incision/Wound: Labs:   Lab Results  Component Value Date   WBC 10.0 07/26/2011   HGB 14.5 07/26/2011   HCT 42.1 07/26/2011   MCV 101.7* 07/26/2011   PLT 235 07/26/2011   EKG: normal EKG, normal sinus rhythm, unchanged from previous tracings.    Radiology: Ct Chest Wo Contrast  03/08/2012  *RADIOLOGY REPORT*  Clinical Data: Basilar fibrosis suggested on chest radiography. Interstitial lung disease.  CT CHEST WITHOUT CONTRAST  Technique:  Multidetector CT imaging of the chest was performed following the standard protocol without IV contrast. High resolution protocol was employed.  Comparison: 02/03/2012  Findings: Small paratracheal and subcarinal nodes are not pathologically enlarged by size criteria.  The left main coronary artery appears to arise from the right cusp and extend in a retroaortic position.  Probable stent within this vessel.  Dense calcification noted.  There is also some calcification of the right coronary artery.  The heart is borderline enlarged.  Peripheral lung interstitial accentuation noted, favoring the lung bases, with slight honeycombing inferiorly in the right middle lobe, lingula, and left lower lobe.  No overt airway thickening. No overt bronchiectasis.  Thoracic spondylosis noted. No significant regions of air trapping.  IMPRESSION:  1. Peripheral fibrosis favoring the lung bases, with several small regions of honeycombing, favoring usual interstitial pneumonitis. 2.  Anomalous origin of the left main coronary artery from the right cusp, with retroaortic positioning.  Original Report Authenticated By: Dellia Cloud, M.D.       FOLLOW UP PLANS AND APPOINTMENTS    Medication List     As of  03/13/2012 10:45 PM    ASK your doctor about these medications         acetaminophen 325 MG tablet   Commonly known as: TYLENOL   Take 325 mg by mouth every 6 (six) hours as needed. For headache      aspirin 81 MG chewable tablet   Chew 1 tablet (81 mg total) by mouth daily.      carvedilol 6.25 MG tablet   Commonly known as: COREG   Take 6.25 mg by mouth 2 (two) times daily with a meal.      fish oil-omega-3 fatty acids 1000 MG capsule   Take 1 g by mouth daily.      L-Arginine 500 MG Tabs   Take 1,000 mg by mouth daily.      nitroGLYCERIN 0.4 MG SL tablet   Commonly known as: NITROSTAT   Place 0.4 mg under the tongue every 5 (five) minutes as needed. For chest pain      pravastatin 20 MG tablet   Commonly known as: PRAVACHOL   Take 20 mg by mouth at bedtime.      sildenafil 100 MG tablet   Commonly known as: VIAGRA   Take 100 mg by mouth daily as needed.      Ticagrelor 90 MG Tabs tablet   Commonly known as: BRILINTA   Take 1 tablet (90 mg total) by mouth 2 (two) times daily.      vitamin B-12 1000 MCG tablet   Commonly known as: CYANOCOBALAMIN   Take 1,000 mcg by mouth daily.      Vitamin D 2000 UNITS tablet   Take 4,000 Units by mouth daily.           Follow-up Information    Follow up with Pamella Pert, MD. In 2 weeks.   Contact information:   [address]  [place] [address] [phone]           Pamella Pert, MD 03/13/2012, 10:45 PM  Pager: [phone] Office: [phone] If no answer: [phone]

## 2012-03-13 NOTE — H&P (Signed)
  Please see paper chart  

## 2012-03-14 LAB — BASIC METABOLIC PANEL
BUN: 9 mg/dL (ref 6–23)
Chloride: 104 mEq/L (ref 96–112)
Creatinine, Ser: 0.62 mg/dL (ref 0.50–1.35)
GFR calc Af Amer: >90 mL/min (ref >90)
GFR calc non Af Amer: >90 mL/min (ref >90)
Glucose, Bld: 107 mg/dL — ABNORMAL HIGH (ref 70–99)
Potassium: 3.7 mEq/L (ref 3.5–5.1)

## 2012-03-14 LAB — CBC
HCT: 38.8 % — ABNORMAL LOW (ref 39.0–52.0)
Hemoglobin: 13.4 g/dL (ref 13.0–17.0)
MCHC: 34.5 g/dL (ref 30.0–36.0)
MCV: 102.6 fL — ABNORMAL HIGH (ref 78.0–100.0)
RDW: 12.9 % (ref 11.5–15.5)

## 2012-03-14 MED FILL — Dextrose Inj 5%: INTRAVENOUS | Qty: 50 | Status: AC

## 2012-03-14 NOTE — Progress Notes (Signed)
CARDIAC REHAB PHASE I   PRE:  Rate/Rhythm: 89SR  BP:  Supine: 125/72  Sitting:   Standing:    SaO2:   MODE:  Ambulation: 900 ft   POST:  Rate/Rhythem: 98SR  BP:  Supine:   Sitting: 130/79  Standing:    SaO2:  2956-2130 Pt walked 900 ft with steady gait. Tolerated well. Denied any complaints. Education completed. I saw pt in March at last admission. Pt has improved diet and continues to ex. Not interested in CRP 2. Will ex on his own. Thinking of doing silver sneakers at age 66.   Duanne Limerick

## 2012-03-14 NOTE — Plan of Care (Signed)
Problem: Phase II Progression Outcomes Goal: Discharge plan in place and appropriate Outcome: Completed/Met Date Met:  03/14/12 Discharged today as planned Goal: Pain controlled with appropriate interventions Outcome: Completed/Met Date Met:  03/14/12 Patient denies any chest pain or pain at groin site today Goal: Hemodynamically stable Outcome: Completed/Met Date Met:  03/14/12 Blood pressure stable NSR with no ectopy noted, O2sats high 90's on room air  Goal: Ambulates up to 600 ft. in hall x 1 Outcome: Completed/Met Date Met:  03/14/12 Ambulated over 600 ft with cardiac rehab. No noted intolerance, pain or shortness of breath with activity. Ambulates with a steady gait independently. Goal: Tolerates diet Outcome: Completed/Met Date Met:  03/14/12 Ate 100% of breakfast, tolerated well.  Goal: Vascular site scale level 0 - I Vascular Site Scale Level 0: No bruising/bleeding/hematoma Level I (Mild): Bruising/Ecchymosis, minimal bleeding/ooozing, palpable hematoma < 3 cm Level II (Moderate): Bleeding not affecting hemodynamic parameters, pseudoaneurysm, palpable hematoma > 3 cm  Outcome: Completed/Met Date Met:  03/14/12 Right groin site level 0 with gauze dressing secured with tegaderm dry and intact

## 2012-03-14 NOTE — Plan of Care (Signed)
Problem: Phase I Progression Outcomes Goal: Initial discharge plan identified Outcome: Completed/Met Date Met:  03/14/12 Planned discharge to home 10/23

## 2012-04-09 ENCOUNTER — Ambulatory Visit (INDEPENDENT_AMBULATORY_CARE_PROVIDER_SITE_OTHER): Payer: 59 | Admitting: Pulmonary Disease

## 2012-04-09 ENCOUNTER — Other Ambulatory Visit: Payer: Self-pay | Admitting: Pulmonary Disease

## 2012-04-09 ENCOUNTER — Encounter: Payer: Self-pay | Admitting: Pulmonary Disease

## 2012-04-09 ENCOUNTER — Other Ambulatory Visit (INDEPENDENT_AMBULATORY_CARE_PROVIDER_SITE_OTHER): Payer: 59

## 2012-04-09 VITALS — BP 120/82 | HR 102 | Temp 98.4°F | Ht 68.0 in | Wt 180.0 lb

## 2012-04-09 DIAGNOSIS — J849 Interstitial pulmonary disease, unspecified: Secondary | ICD-10-CM

## 2012-04-09 DIAGNOSIS — J8410 Pulmonary fibrosis, unspecified: Secondary | ICD-10-CM

## 2012-04-09 LAB — RENAL FUNCTION PANEL
Calcium: 9.4 mg/dL (ref 8.4–10.5)
Chloride: 104 mEq/L (ref 96–112)
Glucose, Bld: 97 mg/dL (ref 70–99)
Potassium: 4.2 mEq/L (ref 3.5–5.3)
Sodium: 140 mEq/L (ref 135–145)

## 2012-04-09 LAB — PULMONARY FUNCTION TEST

## 2012-04-09 LAB — CK: Total CK: 37 U/L (ref 7–232)

## 2012-04-09 NOTE — Addendum Note (Signed)
Addended by: Caryl Ada on: 04/09/2012 11:57 AM   Modules accepted: Orders

## 2012-04-09 NOTE — Progress Notes (Signed)
PFT done today. 

## 2012-04-09 NOTE — Assessment & Plan Note (Signed)
The patient has classic changes on chest CT that are most consistent with usual interstitial pneumonitis.  At least he has excellent exertional tolerance, and normal pulmonary function studies at this time.  I have had a long discussion with him about UIP, and how it can be associated with idiopathic pulmonary fibrosis, autoimmune diseases, and other exposures.  At this point, he will need to an autoimmune workup.  If this is positive, he will need rheumatology evaluation and possibly started on immunosuppressive agents.  If his autoimmune panel is unremarkable, this is most likely idiopathic pulmonary fibrosis.  I have explained to him there is no treatment for this disease at this time other than transplantation.  He could consider entering into investigational trials at [facility], or we could consider pirfenadone next year if approved by FDA.  I will call him with the results of his autoimmune workup, and if unremarkable, I would like to see him back in 6 months.  He will need yearly chest x-ray and pulmonary function studies.

## 2012-04-09 NOTE — Patient Instructions (Addendum)
Will check an autoimmune lab panel, and will call you with the results. If the labwork is normal, would like to see you back in 6mos.  Please call if change in symptoms before then.

## 2012-04-09 NOTE — Progress Notes (Signed)
  Subjective:    Patient ID: Taylor Lyons, male    DOB: 1946/01/24, 66 y.o.   MRN: [id number]  HPI The patient comes in today for followup after his recent CT chest and PFTs.  He was found by chest CT to have classic changes most consistent with usual interstitial pneumonitis.  His PFTs today showed no air fluid structure, no restriction, and a normal diffusion capacity.  I have reviewed all the testing with him in detail, and answered all of his questions.   Review of Systems  Constitutional: Negative for fever and unexpected weight change.  HENT: Negative for ear pain, nosebleeds, congestion, sore throat, rhinorrhea, sneezing, trouble swallowing, dental problem, postnasal drip and sinus pressure.   Eyes: Negative for redness and itching.  Respiratory: Positive for cough. Negative for chest tightness, shortness of breath and wheezing.   Cardiovascular: Negative for palpitations and leg swelling.  Gastrointestinal: Negative for nausea and vomiting.  Genitourinary: Negative for dysuria.  Musculoskeletal: Negative for joint swelling.  Skin: Negative for rash.  Neurological: Negative for headaches.  Hematological: Bruises/bleeds easily.  Psychiatric/Behavioral: Negative for dysphoric mood. The patient is not nervous/anxious.        Objective:   Physical Exam Well-developed male in no acute distress Nose without purulence or discharge noted Neck without lymphadenopathy or thyromegaly Chest with basilar crackles, no wheezing Lower extremities without significant edema, no cyanosis Alert and oriented, moves all 4 extremities.       Assessment & Plan:

## 2012-04-10 LAB — ANTI-SCLERODERMA ANTIBODY: Scleroderma (Scl-70) (ENA) Antibody, IgG: 1 AU/mL (ref <30)

## 2012-04-10 LAB — JO-1 ANTIBODY-IGG: Jo-1 Antibody, IgG: 18 AU/mL (ref <30)

## 2012-04-11 LAB — SEDIMENTATION RATE

## 2012-04-12 ENCOUNTER — Telehealth: Payer: Self-pay | Admitting: Pulmonary Disease

## 2012-04-12 NOTE — Telephone Encounter (Signed)
Pt sent e-mail  States that he remembered that I was exposed to a grayish blue powdery mold a couple of years ago.  He was spreading some freshly ground mulch that my neighbor had obtained from a tree service.  While shoveling the mulch I turned up an area that had the mod and could not avoid inhaling it.  Just wondered if this may have had an effect on my lungs.  And is it still present?  [email or web address] >     Dr Shelle Iron please advise. Thank you

## 2012-04-12 NOTE — Telephone Encounter (Signed)
This is usually not a problem.  Mold is everywhere, and is in every breath we take.  Rarely is this a problem unless we have a specific allergy to that specific mold.

## 2012-04-12 NOTE — Telephone Encounter (Signed)
Pt advised. Ali Mclaurin, CMA  

## 2012-04-13 LAB — RHEUMATOID FACTOR: Rhuematoid fact SerPl-aCnc: <10 IU/mL (ref <=14)

## 2012-04-23 ENCOUNTER — Encounter: Payer: Self-pay | Admitting: Pulmonary Disease

## 2012-09-19 DIAGNOSIS — Z9861 Coronary angioplasty status: Secondary | ICD-10-CM | POA: Diagnosis not present

## 2012-09-19 DIAGNOSIS — I2510 Atherosclerotic heart disease of native coronary artery without angina pectoris: Secondary | ICD-10-CM | POA: Diagnosis not present

## 2012-09-19 DIAGNOSIS — E7800 Pure hypercholesterolemia, unspecified: Secondary | ICD-10-CM | POA: Diagnosis not present

## 2012-09-19 DIAGNOSIS — R0602 Shortness of breath: Secondary | ICD-10-CM | POA: Diagnosis not present

## 2012-10-01 ENCOUNTER — Ambulatory Visit (INDEPENDENT_AMBULATORY_CARE_PROVIDER_SITE_OTHER): Payer: Medicare Other | Admitting: Pulmonary Disease

## 2012-10-01 ENCOUNTER — Encounter: Payer: Self-pay | Admitting: Pulmonary Disease

## 2012-10-01 VITALS — BP 126/70 | HR 74 | Temp 97.6°F | Ht 68.25 in | Wt 171.8 lb

## 2012-10-01 DIAGNOSIS — J8410 Pulmonary fibrosis, unspecified: Secondary | ICD-10-CM | POA: Diagnosis not present

## 2012-10-01 DIAGNOSIS — J849 Interstitial pulmonary disease, unspecified: Secondary | ICD-10-CM

## 2012-10-01 NOTE — Patient Instructions (Addendum)
Continue to stay as active as you can. followup with me in 6mos with chest xray and breathing tests the same day. Please call if change in your breathing prior to your followup.

## 2012-10-01 NOTE — Addendum Note (Signed)
Addended by: Nita Sells on: 10/01/2012 11:00 AM   Modules accepted: Orders

## 2012-10-01 NOTE — Progress Notes (Signed)
  Subjective:    Patient ID: Taylor Lyons, male    DOB: 1945-10-24, 67 y.o.   MRN: [id number]  HPI Patient comes in today for followup of his interstitial lung disease, felt to represent idiopathic pulmonary fibrosis.  The patient has done very well since the last visit, and is staying quite active with no significant shortness of breath.  He denies any cough or mucus production.  He had concerns that his statin may be contributing to his interstitial disease, and has discontinued this.   Review of Systems  Constitutional: Negative for fever and unexpected weight change.  HENT: Positive for congestion, rhinorrhea, voice change and postnasal drip. Negative for ear pain, nosebleeds, sore throat, sneezing, trouble swallowing, dental problem and sinus pressure.        Allergies   Eyes: Negative for redness and itching.  Respiratory: Negative for cough, chest tightness, shortness of breath and wheezing.   Cardiovascular: Negative for palpitations and leg swelling.  Gastrointestinal: Negative for nausea and vomiting.  Genitourinary: Negative for dysuria.  Musculoskeletal: Negative for joint swelling.  Skin: Negative for rash.  Neurological: Negative for headaches.  Hematological: Does not bruise/bleed easily.  Psychiatric/Behavioral: Negative for dysphoric mood. The patient is not nervous/anxious.        Objective:   Physical Exam Well-developed male in no acute distress Nose without purulence or discharge noted Neck without lymphadenopathy or thyromegaly Chest with faint basilar crackles, no wheezes or rhonchi Cardiac exam regular rate and rhythm Lower extremities without edema, no cyanosis Alert and oriented, moves all 4 extremities.       Assessment & Plan:

## 2012-10-01 NOTE — Assessment & Plan Note (Signed)
The patient has done well from a pulmonary standpoint since the last visit, and really has no significant symptoms at this time.  His pulmonary function studies at the end of last year were totally normal.  He is doing so well at this point, that I would continue with yearly surveillance consisting of a chest x-ray and PFTs.  I would not consider pirfenidone treatment unless the patient sees a change either objectively or subjectively.

## 2012-12-26 ENCOUNTER — Other Ambulatory Visit: Payer: Self-pay

## 2013-02-12 DIAGNOSIS — Z23 Encounter for immunization: Secondary | ICD-10-CM | POA: Diagnosis not present

## 2013-02-20 HISTORY — PX: OTHER SURGICAL HISTORY: SHX169

## 2013-03-22 DIAGNOSIS — Z9861 Coronary angioplasty status: Secondary | ICD-10-CM | POA: Diagnosis not present

## 2013-03-22 DIAGNOSIS — I2510 Atherosclerotic heart disease of native coronary artery without angina pectoris: Secondary | ICD-10-CM | POA: Diagnosis not present

## 2013-03-22 DIAGNOSIS — J84112 Idiopathic pulmonary fibrosis: Secondary | ICD-10-CM | POA: Diagnosis not present

## 2013-03-22 DIAGNOSIS — E7800 Pure hypercholesterolemia, unspecified: Secondary | ICD-10-CM | POA: Diagnosis not present

## 2013-03-28 ENCOUNTER — Other Ambulatory Visit: Payer: Self-pay

## 2013-04-01 ENCOUNTER — Other Ambulatory Visit: Payer: Self-pay | Admitting: Pulmonary Disease

## 2013-04-01 ENCOUNTER — Ambulatory Visit (INDEPENDENT_AMBULATORY_CARE_PROVIDER_SITE_OTHER)
Admission: RE | Admit: 2013-04-01 | Discharge: 2013-04-01 | Disposition: A | Discharge status: Home / Self Care | Payer: Medicare Other | Source: Ambulatory Visit | Attending: Pulmonary Disease | Admitting: Pulmonary Disease

## 2013-04-01 ENCOUNTER — Ambulatory Visit (INDEPENDENT_AMBULATORY_CARE_PROVIDER_SITE_OTHER): Payer: Medicare Other | Admitting: Pulmonary Disease

## 2013-04-01 ENCOUNTER — Encounter: Payer: Self-pay | Admitting: Pulmonary Disease

## 2013-04-01 VITALS — BP 132/84 | HR 87 | Temp 98.3°F | Ht 67.5 in | Wt 185.0 lb

## 2013-04-01 DIAGNOSIS — J849 Interstitial pulmonary disease, unspecified: Secondary | ICD-10-CM

## 2013-04-01 DIAGNOSIS — J8410 Pulmonary fibrosis, unspecified: Secondary | ICD-10-CM

## 2013-04-01 DIAGNOSIS — R918 Other nonspecific abnormal finding of lung field: Secondary | ICD-10-CM | POA: Diagnosis not present

## 2013-04-01 LAB — PULMONARY FUNCTION TEST

## 2013-04-01 NOTE — Patient Instructions (Signed)
Work on weight reduction and increasing your fitness Will see you back in one year with chest xray and breathing studies.  Please call if increased symptoms before then.

## 2013-04-01 NOTE — Assessment & Plan Note (Signed)
The patient is doing very well subjectively, and has not seen any change in his exertional tolerance.  He feels he can do anything that he wants to do, and has had no cough either.  His chest x-ray is stable from last year, but his PFTs do show some decline in his total lung capacity and diffusion capacity.  It had long discussion with him about 2 new drugs approved by the FDA for idiopathic pulmonary fibrosis.  Although he has had some decline in pulmonary function testing, I do not think he is an appropriate candidate at this time for the new medication.  If he sees any change in his symptoms, or we continued to see a decline in his pulmonary function studies, may need to reconsider.

## 2013-04-01 NOTE — Progress Notes (Signed)
  Subjective:    Patient ID: Taylor Lyons, male    DOB: January 09, 1946, 67 y.o.   MRN: [id number]  HPI The patient comes in today for followup of his known interstitial lung disease, felt secondary to idiopathic pulmonary fibrosis.  He has done very well since the last visit, with no change in his exertional tolerance, and no cough.  He feels that he is normal.  His chest x-ray today shows no significant change, but his PFTs did show a decline in his total lung capacity and DLCO from last year.  I have reviewed his objective testing in detail with him, and answered all his questions.   Review of Systems  Constitutional: Negative for fever and unexpected weight change.  HENT: Negative for congestion, dental problem, ear pain, nosebleeds, postnasal drip, rhinorrhea, sinus pressure, sneezing, sore throat and trouble swallowing.   Eyes: Negative for redness and itching.  Respiratory: Negative for cough, chest tightness, shortness of breath and wheezing.   Cardiovascular: Negative for palpitations and leg swelling.  Gastrointestinal: Negative for nausea and vomiting.  Genitourinary: Negative for dysuria.  Musculoskeletal: Negative for joint swelling.  Skin: Negative for rash.  Neurological: Negative for headaches.  Hematological: Does not bruise/bleed easily.  Psychiatric/Behavioral: Negative for dysphoric mood. The patient is not nervous/anxious.        Objective:   Physical Exam Well-developed male in no acute distress Nose without purulence or discharge noted Neck without lymphadenopathy or thyromegaly Chest with bibasilar crackles, no wheezing Cardiac exam with regular rate and rhythm Lower extremities without edema, no cyanosis Alert and oriented, moves all 4 extremities.       Assessment & Plan:

## 2013-04-01 NOTE — Progress Notes (Signed)
PFT done today. 

## 2013-09-26 DIAGNOSIS — I10 Essential (primary) hypertension: Secondary | ICD-10-CM | POA: Diagnosis not present

## 2013-09-26 DIAGNOSIS — I2510 Atherosclerotic heart disease of native coronary artery without angina pectoris: Secondary | ICD-10-CM | POA: Diagnosis not present

## 2013-09-26 DIAGNOSIS — J84112 Idiopathic pulmonary fibrosis: Secondary | ICD-10-CM | POA: Diagnosis not present

## 2013-09-26 DIAGNOSIS — Z9861 Coronary angioplasty status: Secondary | ICD-10-CM | POA: Diagnosis not present

## 2013-11-11 DIAGNOSIS — Z9861 Coronary angioplasty status: Secondary | ICD-10-CM | POA: Diagnosis not present

## 2013-11-11 DIAGNOSIS — I10 Essential (primary) hypertension: Secondary | ICD-10-CM | POA: Diagnosis not present

## 2014-03-11 DIAGNOSIS — Z23 Encounter for immunization: Secondary | ICD-10-CM | POA: Diagnosis not present

## 2014-05-01 ENCOUNTER — Encounter (HOSPITAL_COMMUNITY): Payer: Self-pay | Admitting: Cardiology

## 2014-05-05 DIAGNOSIS — Z9861 Coronary angioplasty status: Secondary | ICD-10-CM | POA: Diagnosis not present

## 2014-05-05 DIAGNOSIS — R0602 Shortness of breath: Secondary | ICD-10-CM | POA: Diagnosis not present

## 2014-05-05 DIAGNOSIS — J84112 Idiopathic pulmonary fibrosis: Secondary | ICD-10-CM | POA: Diagnosis not present

## 2014-05-05 DIAGNOSIS — I2510 Atherosclerotic heart disease of native coronary artery without angina pectoris: Secondary | ICD-10-CM | POA: Diagnosis not present

## 2014-05-20 DIAGNOSIS — R0602 Shortness of breath: Secondary | ICD-10-CM | POA: Diagnosis not present

## 2014-05-29 DIAGNOSIS — I2510 Atherosclerotic heart disease of native coronary artery without angina pectoris: Secondary | ICD-10-CM | POA: Diagnosis not present

## 2014-05-29 DIAGNOSIS — I10 Essential (primary) hypertension: Secondary | ICD-10-CM | POA: Diagnosis not present

## 2014-06-09 ENCOUNTER — Telehealth: Payer: Self-pay | Admitting: Pulmonary Disease

## 2014-06-09 DIAGNOSIS — Z9861 Coronary angioplasty status: Secondary | ICD-10-CM | POA: Diagnosis not present

## 2014-06-09 DIAGNOSIS — R0602 Shortness of breath: Secondary | ICD-10-CM | POA: Diagnosis not present

## 2014-06-09 DIAGNOSIS — I2510 Atherosclerotic heart disease of native coronary artery without angina pectoris: Secondary | ICD-10-CM | POA: Diagnosis not present

## 2014-06-09 DIAGNOSIS — J84112 Idiopathic pulmonary fibrosis: Secondary | ICD-10-CM | POA: Diagnosis not present

## 2014-06-09 NOTE — Telephone Encounter (Signed)
Mindy, make sure this pt has upcoming visit with me.  He will need a cxr before the visit.  Thanks.

## 2014-06-09 NOTE — Telephone Encounter (Signed)
appt scheduled 06/23/14 w/ PFT as well. CXR notes added to notes.

## 2014-06-20 ENCOUNTER — Other Ambulatory Visit: Payer: Self-pay | Admitting: Pulmonary Disease

## 2014-06-20 DIAGNOSIS — R0789 Other chest pain: Secondary | ICD-10-CM | POA: Diagnosis not present

## 2014-06-20 DIAGNOSIS — R0600 Dyspnea, unspecified: Secondary | ICD-10-CM

## 2014-06-23 ENCOUNTER — Ambulatory Visit (INDEPENDENT_AMBULATORY_CARE_PROVIDER_SITE_OTHER)
Admission: RE | Admit: 2014-06-23 | Discharge: 2014-06-23 | Disposition: A | Discharge status: Home / Self Care | Payer: Medicare Other | Source: Ambulatory Visit | Attending: Pulmonary Disease | Admitting: Pulmonary Disease

## 2014-06-23 ENCOUNTER — Ambulatory Visit (INDEPENDENT_AMBULATORY_CARE_PROVIDER_SITE_OTHER): Payer: Medicare Other | Admitting: Pulmonary Disease

## 2014-06-23 ENCOUNTER — Encounter: Payer: Self-pay | Admitting: Pulmonary Disease

## 2014-06-23 VITALS — BP 122/66 | HR 71 | Temp 97.0°F | Ht 67.5 in | Wt 182.8 lb

## 2014-06-23 DIAGNOSIS — J849 Interstitial pulmonary disease, unspecified: Secondary | ICD-10-CM

## 2014-06-23 DIAGNOSIS — R0600 Dyspnea, unspecified: Secondary | ICD-10-CM | POA: Diagnosis not present

## 2014-06-23 LAB — PULMONARY FUNCTION TEST
DL/VA % PRED: 74 %
DL/VA: 3.32 ml/min/mmHg/L
DLCO UNC % PRED: 59 %
DLCO unc: 17.12 ml/min/mmHg
FEF 25-75 Post: 3.39 L/sec
FEF 25-75 Pre: 4.57 L/sec
FEF2575-%Change-Post: -25 %
FEF2575-%PRED-POST: 141 %
FEF2575-%PRED-PRE: 190 %
FEV1-%Change-Post: -5 %
FEV1-%PRED-POST: 108 %
FEV1-%Pred-Pre: 114 %
FEV1-PRE: 3.51 L
FEV1-Post: 3.3 L
FEV1FVC-%Change-Post: -1 %
FEV1FVC-%PRED-PRE: 116 %
FEV6-%CHANGE-POST: -4 %
FEV6-%PRED-POST: 100 %
FEV6-%Pred-Pre: 104 %
FEV6-POST: 3.9 L
FEV6-Pre: 4.07 L
FEV6FVC-%CHANGE-POST: 0 %
FEV6FVC-%Pred-Post: 106 %
FEV6FVC-%Pred-Pre: 106 %
FVC-%CHANGE-POST: -3 %
FVC-%PRED-PRE: 98 %
FVC-%Pred-Post: 94 %
FVC-POST: 3.91 L
FVC-Pre: 4.07 L
Post FEV1/FVC ratio: 84 %
Post FEV6/FVC ratio: 100 %
Pre FEV1/FVC ratio: 86 %
Pre FEV6/FVC Ratio: 100 %
RV % pred: 60 %
RV: 1.35 L
TLC % pred: 83 %
TLC: 5.43 L

## 2014-06-23 NOTE — Assessment & Plan Note (Signed)
CXR 02/2012:  Mild basilar scarring.  CT chest 03/2012:  Classic changes of UIP PFT's 03/2012:  No obstruction, TLC 6.27 (102%), DLCO 84% pred. Autoimmune w/u 03/2012:  Normal  PFT"s 03/2013:  No obstruction, TLC 5.31 (81%), DLCO 69% pred CXR 03/2013:  Unchanged. PFT"s 06/2014: no obstruction, FVC 4.07 (98%), TLC 5.43 (83%), DLCO 17.12 (59%)   The patient has known interstitial lung disease that is probably secondary to idiopathic pulmonary fibrosis. Since the last visit a year ago, he has been doing well from a breathing standpoint, and his pulmonary function studies are stable except for a mild decrease in his diffusion capacity. Because the patient feels that he is fairly asymptomatic, I would continue to monitor him at this point. He understands that if he has a significant change in his breathing that he is to let us know prior to the next visit. I will see him back in one year with a chest x-ray and pulmonary function studies.

## 2014-06-23 NOTE — Progress Notes (Signed)
   Subjective:    Patient ID: Taylor Lyons, male    DOB: Sep 29, 1945, 69 y.o.   MRN: [id number]  HPI The patient comes in today for follow-up of his known interstitial lung disease, felt secondary to idiopathic pulmonary fibrosis. Despite this, he enjoys a fairly active quality of life, and his x-rays and pulmonary function studies have been fairly stable over the years. He comes in today where he continues to be active, and feels that his breathing has not changed since the last visit. He denies any significant chronic cough or mucus, but is having a lot of postnasal drip with throat clearing. He is not taking an antihistamine on a daily basis. He has had pulmonary function studies this morning which shows stability of his forced vital capacity and also total lung capacity. His DLCO however has mildly decreased since the last check. He will also have a chest x-ray today prior to leaving.   Review of Systems  Constitutional: Negative for fever and unexpected weight change.  HENT: Positive for congestion and postnasal drip. Negative for dental problem, ear pain, nosebleeds, rhinorrhea, sinus pressure, sneezing, sore throat and trouble swallowing.   Eyes: Negative for redness and itching.  Respiratory: Positive for cough. Negative for chest tightness, shortness of breath and wheezing.   Cardiovascular: Negative for palpitations and leg swelling.  Gastrointestinal: Negative for nausea and vomiting.  Genitourinary: Negative for dysuria.  Musculoskeletal: Negative for joint swelling.  Skin: Negative for rash.  Neurological: Negative for headaches.  Hematological: Does not bruise/bleed easily.  Psychiatric/Behavioral: Negative for dysphoric mood. The patient is not nervous/anxious.        Objective:   Physical Exam Overweight male in no acute distress Nose without purulence or discharge noted Neck without lymphadenopathy or thyromegaly Chest with bibasilar crackles approximately one third the  way up. Cardiac exam with regular rate and rhythm Lower extremities with minimal edema, no cyanosis Alert and oriented, moves all 4 extremities.       Assessment & Plan:

## 2014-06-23 NOTE — Patient Instructions (Signed)
Your breathing tests are overall stable from 2014.  Will check a chest xray today and let you know the results. Will see you back in one year with repeat xray and breathing studies.  However, you need to call if you see a change in your breathing before then.

## 2014-06-23 NOTE — Progress Notes (Signed)
PFT done today. 

## 2014-06-24 DIAGNOSIS — Z9861 Coronary angioplasty status: Secondary | ICD-10-CM | POA: Diagnosis not present

## 2014-06-24 DIAGNOSIS — F329 Major depressive disorder, single episode, unspecified: Secondary | ICD-10-CM | POA: Diagnosis not present

## 2014-06-24 DIAGNOSIS — I2510 Atherosclerotic heart disease of native coronary artery without angina pectoris: Secondary | ICD-10-CM | POA: Diagnosis not present

## 2014-06-24 DIAGNOSIS — R0602 Shortness of breath: Secondary | ICD-10-CM | POA: Diagnosis not present

## 2014-07-04 DIAGNOSIS — R0602 Shortness of breath: Secondary | ICD-10-CM | POA: Diagnosis not present

## 2014-08-14 IMAGING — CR DG CHEST 2V
2 series · 2 of 2 positions shown · non-contrast
Comparison: 02/11/2012

CLINICAL DATA: Shortness of breath, pulmonary fibrosis.

CHEST - 2 VIEW

[w chest pa]
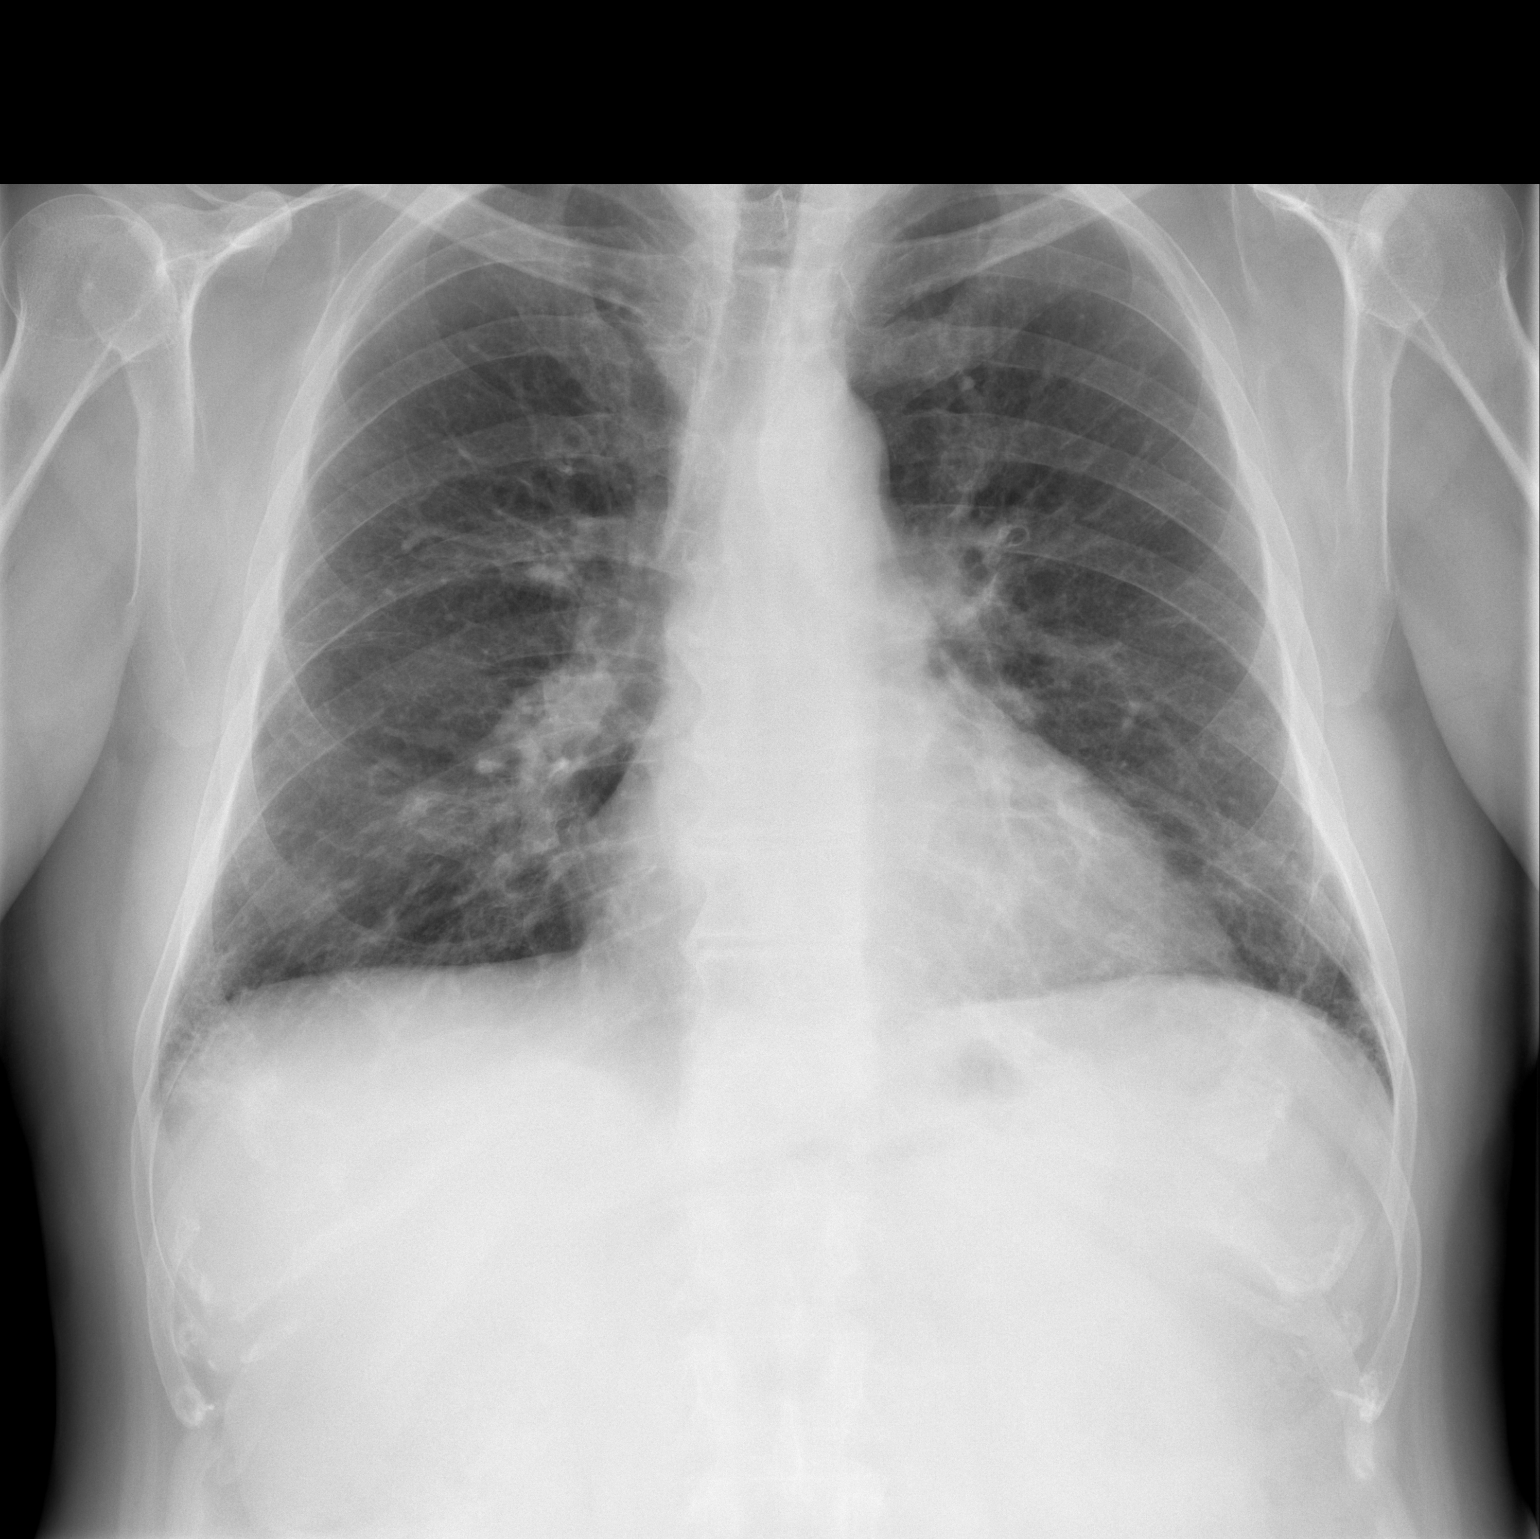

[w chest lat]
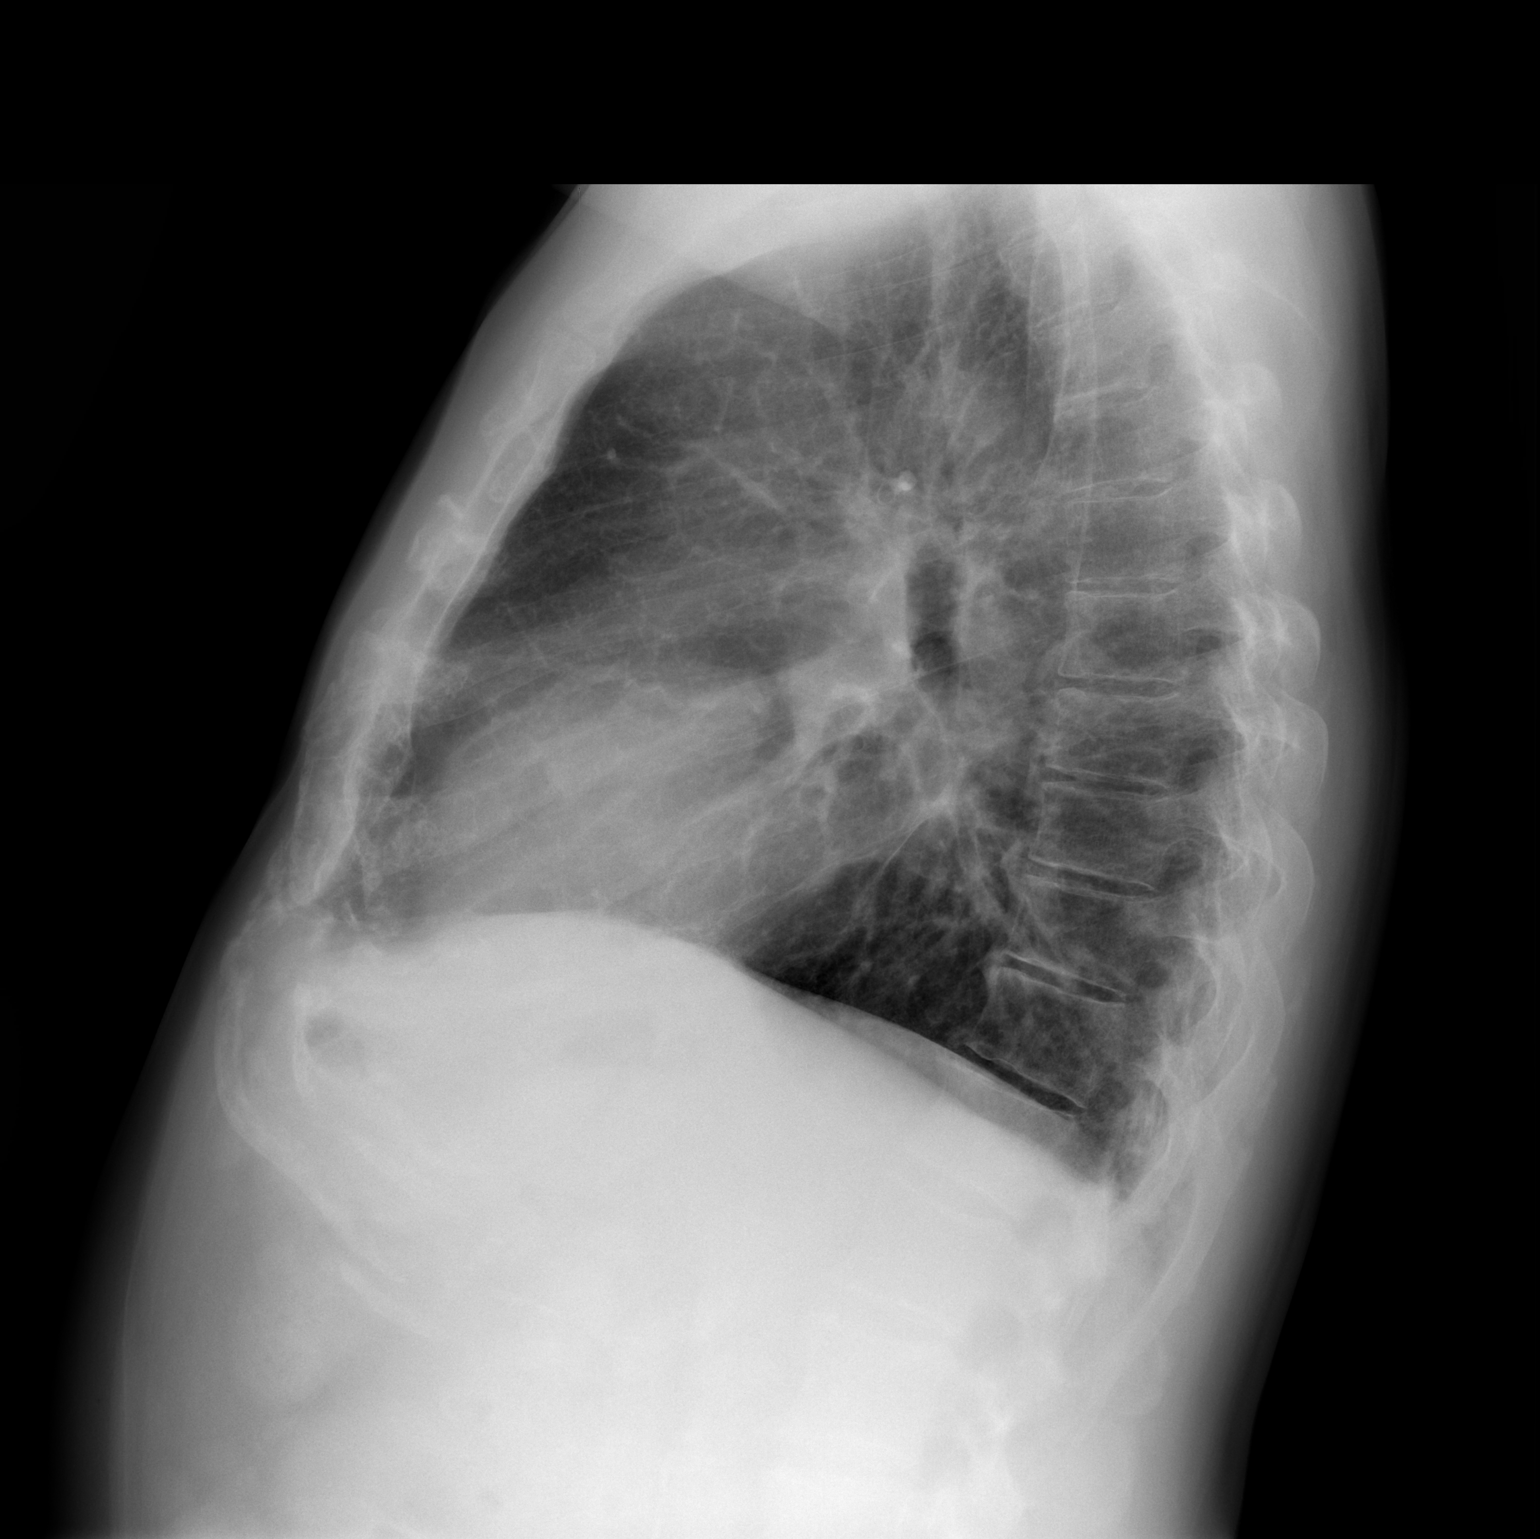

[2 of 2 positions shown; findings below may reference images not displayed]

FINDINGS: Linear densities are noted in the lung bases, likely
scarring / fibrosis.  Heart is upper limits normal in size.  No
confluent airspace opacities.  No effusions.  No acute bony
abnormality.
IMPRESSION: Bibasilar scarring/fibrosis.  No acute findings.

## 2015-01-14 DIAGNOSIS — Z9861 Coronary angioplasty status: Secondary | ICD-10-CM | POA: Diagnosis not present

## 2015-01-14 DIAGNOSIS — F329 Major depressive disorder, single episode, unspecified: Secondary | ICD-10-CM | POA: Diagnosis not present

## 2015-01-14 DIAGNOSIS — I2510 Atherosclerotic heart disease of native coronary artery without angina pectoris: Secondary | ICD-10-CM | POA: Diagnosis not present

## 2015-01-14 DIAGNOSIS — R0602 Shortness of breath: Secondary | ICD-10-CM | POA: Diagnosis not present

## 2015-08-19 DIAGNOSIS — I2510 Atherosclerotic heart disease of native coronary artery without angina pectoris: Secondary | ICD-10-CM | POA: Diagnosis not present

## 2015-08-19 DIAGNOSIS — R0602 Shortness of breath: Secondary | ICD-10-CM | POA: Diagnosis not present

## 2015-08-19 DIAGNOSIS — Z9861 Coronary angioplasty status: Secondary | ICD-10-CM | POA: Diagnosis not present

## 2015-08-19 DIAGNOSIS — G4734 Idiopathic sleep related nonobstructive alveolar hypoventilation: Secondary | ICD-10-CM | POA: Diagnosis not present

## 2015-09-10 DIAGNOSIS — H2513 Age-related nuclear cataract, bilateral: Secondary | ICD-10-CM | POA: Diagnosis not present

## 2015-09-10 DIAGNOSIS — Z0100 Encounter for examination of eyes and vision without abnormal findings: Secondary | ICD-10-CM | POA: Diagnosis not present

## 2015-10-21 ENCOUNTER — Other Ambulatory Visit: Payer: Self-pay | Admitting: Pulmonary Disease

## 2015-10-21 DIAGNOSIS — R0600 Dyspnea, unspecified: Secondary | ICD-10-CM

## 2015-10-22 ENCOUNTER — Ambulatory Visit (INDEPENDENT_AMBULATORY_CARE_PROVIDER_SITE_OTHER): Payer: Medicare Other | Admitting: Pulmonary Disease

## 2015-10-22 ENCOUNTER — Encounter: Payer: Self-pay | Admitting: Pulmonary Disease

## 2015-10-22 VITALS — BP 144/84 | HR 91 | Ht 67.5 in | Wt 202.0 lb

## 2015-10-22 DIAGNOSIS — R0600 Dyspnea, unspecified: Secondary | ICD-10-CM

## 2015-10-22 DIAGNOSIS — J849 Interstitial pulmonary disease, unspecified: Secondary | ICD-10-CM

## 2015-10-22 LAB — PULMONARY FUNCTION TEST
DL/VA % PRED: 79 %
DL/VA: 3.53 ml/min/mmHg/L
DLCO COR % PRED: 57 %
DLCO COR: 16.64 ml/min/mmHg
DLCO UNC: 17.74 ml/min/mmHg
DLCO unc % pred: 61 %
FEF 25-75 POST: 3.67 L/s
FEF 25-75 PRE: 3.26 L/s
FEF2575-%CHANGE-POST: 12 %
FEF2575-%PRED-PRE: 139 %
FEF2575-%Pred-Post: 157 %
FEV1-%CHANGE-POST: 2 %
FEV1-%Pred-Post: 101 %
FEV1-%Pred-Pre: 98 %
FEV1-PRE: 2.97 L
FEV1-Post: 3.05 L
FEV1FVC-%Change-Post: 5 %
FEV1FVC-%PRED-PRE: 110 %
FEV6-%CHANGE-POST: -2 %
FEV6-%PRED-POST: 92 %
FEV6-%Pred-Pre: 94 %
FEV6-POST: 3.54 L
FEV6-Pre: 3.62 L
FEV6FVC-%PRED-POST: 106 %
FEV6FVC-%Pred-Pre: 106 %
FVC-%Change-Post: -2 %
FVC-%Pred-Post: 86 %
FVC-%Pred-Pre: 88 %
FVC-Post: 3.54 L
FVC-Pre: 3.62 L
POST FEV1/FVC RATIO: 86 %
PRE FEV1/FVC RATIO: 82 %
PRE FEV6/FVC RATIO: 100 %
Post FEV6/FVC ratio: 100 %
RV % pred: 48 %
RV: 1.1 L
TLC % pred: 71 %
TLC: 4.65 L

## 2015-10-22 NOTE — Progress Notes (Signed)
Subjective:    Patient ID: Taylor Lyons, male    DOB: 1945/10/06, 70 y.o.   MRN: [id number]  HPI Follow-up for evaluation of interstitial lung disease.   Taylor Lyons is a 70 year old with a pulnmonary fibrosis. This was diagnosed in 2013-14. His CT scan shows basilar prominence in UIP pattern. He had an immunology workup at that time that was negative. The patient feels that his fibrosis is caused by Pravachol use at that time. He was being follwed by Dr. Gwenette Greet. He is not in any treatment as his PFTs and lung function has remained stable.  He complains of occasional cough, nonproductive of sputum. He denies any worsening dyspnea, wheezing, reduction in exercise capacity.  DATA: PFTs 10/22/15-   FVC 3.5 (86%], FEV1 3.0 [101%), F/F 86, TLC 71%, DLCO 61% 06/23/14-   FVC 4.0 [98%], FEV1 3.5 [114%], F/F 86 04/01/13-FVC 4.1 [98%], FEV1 2.54 [140%], F/F 86, TLC 66%, DLCO 69% 04/09/12-FVC 4.6 [109%], FEV1 3.7 [126%], F/F 79, TLC 102%, DLCO 84%.  Imaging CT chest 10/78/13-peripheral fibrosis with small areas of honeycombing. UIP pattern.  Lab tests.  04/09/12-negative for ANA, CCP, Jo- 1, RA, RNP, SCL 70.  Social history: Never smoker, daily wine use, no drug use.  Family history: Mother-allergies Father-coronary artery disease  Past Medical History  Diagnosis Date  . Other and unspecified angina pectoris   . Essential hypertension, benign   . Pure hyperglyceridemia   . Family history of ischemic heart disease   . Coronary artery disease   . Old MI (myocardial infarction)     "never knew it"  . Shortness of breath     '" MILD "    Current outpatient prescriptions:  .  acetaminophen (TYLENOL) 325 MG tablet, Take 325 mg by mouth every 6 (six) hours as needed. For headache, Disp: , Rfl:  .  aspirin EC 81 MG tablet, Take 243 mg by mouth daily., Disp: , Rfl:  .  carvedilol (COREG) 12.5 MG tablet, Take 12.5 mg by mouth 2 (two) times daily with a meal., Disp: , Rfl:  .  Cholecalciferol  (VITAMIN D) 2000 UNITS tablet, Take 4,000 Units by mouth daily., Disp: , Rfl:  .  escitalopram (LEXAPRO) 10 MG tablet, Take 10 mg by mouth daily., Disp: , Rfl:  .  fish oil-omega-3 fatty acids 1000 MG capsule, Take 1 g by mouth daily., Disp: , Rfl:  .  L-Arginine 500 MG TABS, Take 1,000 mg by mouth daily., Disp: , Rfl:  .  nitroGLYCERIN (NITROSTAT) 0.4 MG SL tablet, Place 0.4 mg under the tongue every 5 (five) minutes as needed. For chest pain, Disp: , Rfl:  .  sildenafil (VIAGRA) 100 MG tablet, Take 100 mg by mouth daily as needed., Disp: , Rfl:  .  valsartan (DIOVAN) 160 MG tablet, Take 160 mg by mouth daily., Disp: , Rfl:    Review of Systems  Nonproductive cough, no dyspnea, wheezing, sputum production, hemoptysis. No chest pain, palpitations. No nausea, vomiting, diarrhea, constipation. No fevers, chills, loss of weight, loss of appetite. All other review of systems are negative     Objective:   Physical Exam Blood pressure 144/84, pulse 91, height 5' 7.5" (1.715 m), weight 202 lb (91.627 kg), SpO2 96 %. Gen: No apparent distress Neuro: No gross focal deficits. HEENT: No JVD, lymphadenopathy, thyromegaly. RS: Basal crackles CVS: S1-S2 heard, no murmurs rubs gallops. Abdomen: Soft, positive bowel sounds. Musculoskeletal: No edema.    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  This is likely idiopathic pulmonary fibrosis. He does not have any exposures, relevant symptoms or signs of connective tissue, interstitial lung disease. Review of his PFTs show progressive decline in lung function also symptomatically he feels well. I will evaluate with a high-resolution CT of the chest and a repeat serologies for immunology, autoimmune disease. If the CT shows progression of disease then I will discuss initiation of immunotherapy such as pirfernidone and nintedanib.  Plan: High res CT of chest Return in 1 month.  Marshell Garfinkel MD [facility] Pager  [phone] If no answer or after 3pm call: [phone] 10/22/2015, 5:36 PM

## 2015-10-22 NOTE — Patient Instructions (Signed)
We will schedule you for a high-resolution CT of the chest and send urine for serologies to evaluate interstitial lung disease.  Return to clinic in 1 month to review results and further treatment if necessary.

## 2015-10-22 NOTE — Progress Notes (Signed)
PFT done today. 

## 2015-11-03 ENCOUNTER — Ambulatory Visit (INDEPENDENT_AMBULATORY_CARE_PROVIDER_SITE_OTHER)
Admission: RE | Admit: 2015-11-03 | Discharge: 2015-11-03 | Disposition: A | Discharge status: Home / Self Care | Payer: Medicare Other | Source: Ambulatory Visit | Attending: Pulmonary Disease | Admitting: Pulmonary Disease

## 2015-11-03 DIAGNOSIS — R05 Cough: Secondary | ICD-10-CM | POA: Diagnosis not present

## 2015-11-03 DIAGNOSIS — J849 Interstitial pulmonary disease, unspecified: Secondary | ICD-10-CM

## 2015-11-05 NOTE — Progress Notes (Signed)
Quick Note:  LMOM TCB x1. ______ 

## 2015-12-01 ENCOUNTER — Encounter: Payer: Self-pay | Admitting: Pulmonary Disease

## 2015-12-01 ENCOUNTER — Ambulatory Visit (INDEPENDENT_AMBULATORY_CARE_PROVIDER_SITE_OTHER): Payer: Medicare Other | Admitting: Pulmonary Disease

## 2015-12-01 VITALS — BP 136/94 | HR 102 | Temp 98.3°F | Ht 67.5 in | Wt 202.0 lb

## 2015-12-01 DIAGNOSIS — J849 Interstitial pulmonary disease, unspecified: Secondary | ICD-10-CM | POA: Diagnosis not present

## 2015-12-01 NOTE — Patient Instructions (Addendum)
It is nice to meet you today. We will schedule you for a 6 minute walk today. Follow up appointment after 6 minute walk completed. Please contact office for sooner follow up if symptoms do not improve or worsen or seek emergency care

## 2015-12-01 NOTE — Progress Notes (Signed)
History of Present Illness Taylor Lyons is a 70 y.o. male with Pulmonary Fibrosis diagnosed 2013-2014,, immunology work up negative, former [facility] patient now followed by Dr. Vaughan Browner.   7/11/2017One Month Follow Up: Pt. Presents to the office today for one month follow up after HRCT to evaluate progression of IPF. We did discuss that there has been a mild progression of his IPF since 2013. We also discussed that his DLCO has decreased since last testing was done. He is wearing nocturnal oxygen at 2L [place] per his cardiologist, and is compliant with this every night. We have discussed that there are medications that are available to slow the progression of disease.  He is weary of starting any of the immunotherapy  Medications because they are new and not historically proven. He states that he is not botheed by his breathing at present. He denies fever, purulent secretions, chest pain, cough, orthopnea or hemoptysis.  Tests: HRCT>> 11/03/2015  IMPRESSION: 1. The appearance of the lungs is compatible with interstitial lung disease, and the overall spectrum of findings is considered diagnostic of usual interstitial pneumonia (UIP) at this time, with slight progression compared to the prior study from 03/08/2012, as discussed above  DATA: PFTs 10/22/15- FVC 3.5 (86%], FEV1 3.0 [101%), F/F 86, TLC 71%, DLCO 61% 06/23/14- FVC 4.0 [98%], FEV1 3.5 [114%], F/F 86 04/01/13-FVC 4.1 [98%], FEV1 2.54 [140%], F/F 86, TLC 66%, DLCO 69% 04/09/12-FVC 4.6 [109%], FEV1 3.7 [126%], F/F 79, TLC 102%, DLCO 84%. 10/22/2015- FVC 3.62 ( 88%), FEV1 2.97 ( 98%) F/F: 105%, TLC 71%, DLCO 61% Imaging CT chest 10/78/13-peripheral fibrosis with small areas of honeycombing. UIP pattern.  Lab tests.  04/09/12-negative for ANA, CCP, Jo- 1, RA, RNP, SCL 70.  Social history: Never smoker, daily wine use, no drug use.  Family history: Mother-allergies Father-coronary artery disease   Past medical hx Past Medical  History  Diagnosis Date  . Other and unspecified angina pectoris   . Essential hypertension, benign   . Pure hyperglyceridemia   . Family history of ischemic heart disease   . Coronary artery disease   . Old MI (myocardial infarction)     "never knew it"  . Shortness of breath     '" MILD "     Past surgical hx, Family hx, Social hx all reviewed.  Current Outpatient Prescriptions on File Prior to Visit  Medication Sig  . acetaminophen (TYLENOL) 325 MG tablet Take 325 mg by mouth every 6 (six) hours as needed. For headache  . aspirin EC 81 MG tablet Take 243 mg by mouth daily.  . carvedilol (COREG) 12.5 MG tablet Take 12.5 mg by mouth 2 (two) times daily with a meal.  . Cholecalciferol (VITAMIN D) 2000 UNITS tablet Take 4,000 Units by mouth daily.  Marland Kitchen escitalopram (LEXAPRO) 10 MG tablet Take 10 mg by mouth daily.  . fish oil-omega-3 fatty acids 1000 MG capsule Take 1 g by mouth daily.  Marland Kitchen L-Arginine 500 MG TABS Take 1,000 mg by mouth daily.  . nitroGLYCERIN (NITROSTAT) 0.4 MG SL tablet Place 0.4 mg under the tongue every 5 (five) minutes as needed. For chest pain  . sildenafil (VIAGRA) 100 MG tablet Take 100 mg by mouth daily as needed.  . valsartan (DIOVAN) 160 MG tablet Take 160 mg by mouth daily.   No current facility-administered medications on file prior to visit.     Allergies  Allergen Reactions  . Nsaids Rash    Takes ASA at home  Review Of Systems:  Constitutional:   No  weight loss, night sweats,  Fevers, chills, fatigue, or  lassitude.  HEENT:   No headaches,  Difficulty swallowing,  Tooth/dental problems, or  Sore throat,                No sneezing, itching, ear ache, nasal congestion, post nasal drip,   CV:  No chest pain,  Orthopnea, PND, swelling in lower extremities, anasarca, dizziness, palpitations, syncope.   GI  No heartburn, indigestion, abdominal pain, nausea, vomiting, diarrhea, change in bowel habits, loss of appetite, bloody stools.   Resp: +  shortness of breath with extreme exertion not  at rest.  No excess mucus, no productive cough,  No non-productive cough,  No coughing up of blood.  No change in color of mucus.  No wheezing.  No chest wall deformity  Skin: no rash or lesions.  GU: no dysuria, change in color of urine, no urgency or frequency.  No flank pain, no hematuria   MS:  No joint pain or swelling.  No decreased range of motion.  No back pain.  Psych:  No change in mood or affect. No depression or anxiety.  No memory loss.   Vital Signs BP 136/94 mmHg  Pulse 102  Temp(Src) 98.3 F (36.8 C) (Oral)  Ht 5' 7.5" (1.715 m)  Wt 202 lb (91.627 kg)  BMI 31.15 kg/m2  SpO2 98%   Physical Exam:  General- No distress,  A&Ox3, pleasant. ENT: No sinus tenderness, TM clear, pale nasal mucosa, no oral exudate,no post nasal drip, no LAN Cardiac: S1, S2, regular rate and rhythm, no murmur Chest: No wheeze/++ Crackles bilaterally, No  dullness; no accessory muscle use, no nasal flaring, no sternal retractions Abd.: Soft Non-tender Ext: No clubbing cyanosis, edema Neuro:  normal strength Skin: No rashes, warm and dry Psych: normal mood and behavior   Assessment/Plan  Interstitial lung disease  IPF with minimal  Progression  Pt. Is not interested in immunotherapy. Plan: We will schedule you for a 6 minute walk today. Follow up appointment after 6 minute walk completed. Please contact office for sooner follow up if symptoms do not improve or worsen or seek emergency care       Magdalen Spatz, NP 12/01/2015  3:35 PM    Attending note: I have seen and examined the patient with nurse practitioner/resident and agree with the note. History, labs and imaging reviewed.  CT images reviewed with the patient. They are consistent with IPF.  I discussed use of pirfenidone or nintedanib but he prefer not to use these meds. We will continue to monitor him for progression of disease.  Marshell Garfinkel MD [facility] Pager [phone] If no answer or after 3pm call: [phone] 12/01/2015, 4:56 PM

## 2015-12-01 NOTE — Assessment & Plan Note (Signed)
  IPF with minimal  Progression  Pt. Is not interested in immunotherapy. Plan: We will schedule you for a 6 minute walk today. Follow up appointment after 6 minute walk completed. Please contact office for sooner follow up if symptoms do not improve or worsen or seek emergency care

## 2016-01-18 ENCOUNTER — Ambulatory Visit: Payer: Medicare Other | Admitting: Pulmonary Disease

## 2016-01-18 ENCOUNTER — Ambulatory Visit: Payer: Medicare Other

## 2016-01-27 DIAGNOSIS — Z23 Encounter for immunization: Secondary | ICD-10-CM | POA: Diagnosis not present

## 2016-10-12 DIAGNOSIS — R0602 Shortness of breath: Secondary | ICD-10-CM | POA: Diagnosis not present

## 2016-10-12 DIAGNOSIS — G4734 Idiopathic sleep related nonobstructive alveolar hypoventilation: Secondary | ICD-10-CM | POA: Diagnosis not present

## 2016-10-12 DIAGNOSIS — I2510 Atherosclerotic heart disease of native coronary artery without angina pectoris: Secondary | ICD-10-CM | POA: Diagnosis not present

## 2016-10-12 DIAGNOSIS — Z9861 Coronary angioplasty status: Secondary | ICD-10-CM | POA: Diagnosis not present

## 2017-03-07 DIAGNOSIS — Z23 Encounter for immunization: Secondary | ICD-10-CM | POA: Diagnosis not present

## 2017-10-12 DIAGNOSIS — I2510 Atherosclerotic heart disease of native coronary artery without angina pectoris: Secondary | ICD-10-CM | POA: Diagnosis not present

## 2017-10-12 DIAGNOSIS — Z9861 Coronary angioplasty status: Secondary | ICD-10-CM | POA: Diagnosis not present

## 2017-10-12 DIAGNOSIS — G4734 Idiopathic sleep related nonobstructive alveolar hypoventilation: Secondary | ICD-10-CM | POA: Diagnosis not present

## 2017-10-12 DIAGNOSIS — R0602 Shortness of breath: Secondary | ICD-10-CM | POA: Diagnosis not present

## 2017-12-12 DIAGNOSIS — D485 Neoplasm of uncertain behavior of skin: Secondary | ICD-10-CM | POA: Diagnosis not present

## 2017-12-12 DIAGNOSIS — Z79899 Other long term (current) drug therapy: Secondary | ICD-10-CM | POA: Diagnosis not present

## 2017-12-12 DIAGNOSIS — D510 Vitamin B12 deficiency anemia due to intrinsic factor deficiency: Secondary | ICD-10-CM | POA: Diagnosis not present

## 2017-12-12 DIAGNOSIS — E559 Vitamin D deficiency, unspecified: Secondary | ICD-10-CM | POA: Diagnosis not present

## 2017-12-12 DIAGNOSIS — E785 Hyperlipidemia, unspecified: Secondary | ICD-10-CM | POA: Diagnosis not present

## 2017-12-12 DIAGNOSIS — D2339 Other benign neoplasm of skin of other parts of face: Secondary | ICD-10-CM | POA: Diagnosis not present

## 2017-12-12 DIAGNOSIS — L821 Other seborrheic keratosis: Secondary | ICD-10-CM | POA: Diagnosis not present

## 2017-12-12 DIAGNOSIS — D649 Anemia, unspecified: Secondary | ICD-10-CM | POA: Diagnosis not present

## 2017-12-12 DIAGNOSIS — R109 Unspecified abdominal pain: Secondary | ICD-10-CM | POA: Diagnosis not present

## 2017-12-12 DIAGNOSIS — Z125 Encounter for screening for malignant neoplasm of prostate: Secondary | ICD-10-CM | POA: Diagnosis not present

## 2017-12-25 DIAGNOSIS — R109 Unspecified abdominal pain: Secondary | ICD-10-CM | POA: Diagnosis not present

## 2017-12-29 ENCOUNTER — Encounter: Payer: Self-pay | Admitting: Gastroenterology

## 2018-01-01 ENCOUNTER — Other Ambulatory Visit: Payer: Self-pay | Admitting: Family Medicine

## 2018-01-01 DIAGNOSIS — R1013 Epigastric pain: Secondary | ICD-10-CM

## 2018-01-03 ENCOUNTER — Ambulatory Visit
Admission: RE | Admit: 2018-01-03 | Discharge: 2018-01-03 | Disposition: A | Discharge status: Home / Self Care | Payer: Medicare Other | Source: Ambulatory Visit | Attending: Family Medicine | Admitting: Family Medicine

## 2018-01-03 DIAGNOSIS — K8020 Calculus of gallbladder without cholecystitis without obstruction: Secondary | ICD-10-CM | POA: Diagnosis not present

## 2018-01-03 DIAGNOSIS — R1013 Epigastric pain: Secondary | ICD-10-CM

## 2018-01-03 MED ORDER — IOPAMIDOL (ISOVUE-300) INJECTION 61%
100.0000 mL | Freq: Once | INTRAVENOUS | Status: AC | PRN
  Administered 2018-01-03: 100 mL via INTRAVENOUS

## 2018-01-09 DIAGNOSIS — K869 Disease of pancreas, unspecified: Secondary | ICD-10-CM | POA: Diagnosis not present

## 2018-01-09 DIAGNOSIS — Z0189 Encounter for other specified special examinations: Secondary | ICD-10-CM | POA: Diagnosis not present

## 2018-01-16 ENCOUNTER — Ambulatory Visit (INDEPENDENT_AMBULATORY_CARE_PROVIDER_SITE_OTHER): Payer: Medicare Other | Admitting: Gastroenterology

## 2018-01-16 ENCOUNTER — Encounter: Payer: Self-pay | Admitting: Gastroenterology

## 2018-01-16 VITALS — BP 138/68 | HR 102 | Ht 67.5 in | Wt 181.1 lb

## 2018-01-16 DIAGNOSIS — Z1211 Encounter for screening for malignant neoplasm of colon: Secondary | ICD-10-CM

## 2018-01-16 DIAGNOSIS — R1013 Epigastric pain: Secondary | ICD-10-CM | POA: Diagnosis not present

## 2018-01-16 DIAGNOSIS — R9389 Abnormal findings on diagnostic imaging of other specified body structures: Secondary | ICD-10-CM | POA: Diagnosis not present

## 2018-01-16 MED ORDER — PEG 3350-KCL-NABCB-NACL-NASULF 236 G PO SOLR: 4000.0000 mL | Freq: Once | ORAL | 0 refills | Status: AC

## 2018-01-16 NOTE — Patient Instructions (Addendum)
Your provider has requested that you go to the basement level for lab work before leaving today. Press "B" on the elevator. The lab is located at the first door on the left as you exit the elevator.  You have been scheduled for a CT scan of the abdomen at [address] ([address]---this is in the same building as Press photographer).   You are scheduled on 01-30-18 at 1:30 pm. You should arrive 15 minutes prior to your appointment time for registration. Please follow the written instructions below on the day of your exam:  WARNING: IF YOU ARE ALLERGIC TO IODINE/X-RAY DYE, PLEASE NOTIFY RADIOLOGY IMMEDIATELY AT [phone]! YOU WILL BE GIVEN A 13 HOUR PREMEDICATION PREP.  1) Do not eat anything after 9:30 am (4 hours prior to your test) 2) You have been given 2 bottles of oral contrast to drink. The solution may taste better if refrigerated, but do NOT add ice or any other liquid to this solution. Shake well before drinking.    Drink 1 bottle of contrast @ 11:30 am (2 hours prior to your exam)  Drink 1 bottle of contrast @ 12:30 pm (1 hour prior to your exam)  You may take any medications as prescribed with a small amount of water except for the following: Metformin, Glucophage, Glucovance, Avandamet, Riomet, Fortamet, Actoplus Met, Janumet, Glumetza or Metaglip. The above medications must be held the day of the exam AND 48 hours after the exam.  The purpose of you drinking the oral contrast is to aid in the visualization of your intestinal tract. The contrast solution may cause some diarrhea. Before your exam is started, you will be given a small amount of fluid to drink. Depending on your individual set of symptoms, you may also receive an intravenous injection of x-ray contrast/dye. Plan on being at [facility] for 30 minutes or longer, depending on the type of exam you are having performed.  This test typically takes 30-45 minutes to complete.  If you have any  questions regarding your exam or if you need to reschedule, you may call the CT department at [phone] between the hours of 8:00 am and 5:00 pm, Monday-Friday.  ________________________________________________________________________  Taylor Lyons have been scheduled for a colonoscopy. Please follow written instructions given to you at your visit today.  Please pick up your prep supplies at the pharmacy within the next 1-3 days. If you use inhalers (even only as needed), please bring them with you on the day of your procedure. Your physician has requested that you go to www.[email or web address].com and enter the access code given to you at your visit today. This web site gives a general overview about your procedure. However, you should still follow specific instructions given to you by our office regarding your preparation for the procedure.

## 2018-01-16 NOTE — Progress Notes (Signed)
01/16/2018 Taylor Lyons [id number] 04-May-1946   HISTORY OF PRESENT ILLNESS: This is a 72 year old male who is new to our office.  He has been referred here by his PCP, Dr. Rex Kras, for evaluation regarding epigastric abdominal pain and an abnormal CT scan.  He tells me that he has been having upper abdominal pain for about a month and a half now.  Is about 90% improved at this point.  Anyway, his PCP initially put him on Nexium and Maalox to treat reflux, etc.  He had no improvement with that medication so he had a CT scan of the abdomen and pelvis performed, which showed the following:  IMPRESSION: Ill-defined density in the gastrohepatic and hepatic duodenal ligaments, which is of uncertain origin. This is likely inflammatory in etiology, and could be related to previous pancreatitis. Neoplasm is considered less likely. Suggest correlation with pancreatic enzymes and short-term follow-up by CT in 1 month.  Chronic calcific pancreatitis. No radiographic signs of acute pancreatitis.  Gallstones are seen, however there is no evidence of cholecystitis or biliary dilatation.  Ectatic abdominal aorta at risk for aneurysm development. Recommend follow up by [place] in 5 years. This recommendation follows ACR consensus guidelines: White Paper of the ACR Incidental Findings Committee II on Vascular Findings. [place] [id number].  He tells me that he has had never had a history of pancreatitis in the past.  Labs have also been performed and showed normal triglycerides.  Also CMP, CBC, iron studies, Ca125 level, amylase, lipase were all normal/unremarkable.  Never had colonoscopy in the past.  No rectal bleeding.  Moves his bowels well without issues.  Is on O2 at nighttime.   Past Medical History:  Diagnosis Date  . Coronary artery disease   . Essential hypertension, benign   . Family history of ischemic heart disease   . Old MI (myocardial infarction)    "never knew it"  .  Other and unspecified angina pectoris   . Pure hyperglyceridemia   . Shortness of breath    '" MILD "   Past Surgical History:  Procedure Laterality Date  . CARDIAC CATHETERIZATION  07/25/11   "1; first time ever"  . CAROTID ARTERY ANGIOPLASTY    . CORONARY ANGIOPLASTY WITH STENT PLACEMENT    . LEFT HEART CATHETERIZATION WITH CORONARY ANGIOGRAM N/A 07/19/2011   Procedure: LEFT HEART CATHETERIZATION WITH CORONARY ANGIOGRAM;  Surgeon: Laverda Page, MD;  Location: [facility] CATH LAB;  Service: Cardiovascular;  Laterality: N/A;  . LEFT HEART CATHETERIZATION WITH CORONARY ANGIOGRAM N/A 03/13/2012   Procedure: LEFT HEART CATHETERIZATION WITH CORONARY ANGIOGRAM;  Surgeon: Laverda Page, MD;  Location: [facility] CATH LAB;  Service: Cardiovascular;  Laterality: N/A;  . PERCUTANEOUS CORONARY STENT INTERVENTION (PCI-S) N/A 07/25/2011   Procedure: PERCUTANEOUS CORONARY STENT INTERVENTION (PCI-S);  Surgeon: Laverda Page, MD;  Location: [facility] CATH LAB;  Service: Cardiovascular;  Laterality: N/A;  . PERCUTANEOUS CORONARY STENT INTERVENTION (PCI-S) Right 03/13/2012   Procedure: PERCUTANEOUS CORONARY STENT INTERVENTION (PCI-S);  Surgeon: Laverda Page, MD;  Location: [facility] CATH LAB;  Service: Cardiovascular;  Laterality: Right;  . pulmonary fibrosis  02/2013   Both-idiopathic    reports that he has never smoked. He quit smokeless tobacco use about 8 years ago.  His smokeless tobacco use included snuff. He reports that he drinks about 21.0 standard drinks of alcohol per week. He reports that he does not use drugs. family history includes Allergies in his mother; Cancer in his paternal grandmother; Coronary  artery disease (age of onset: 43) in his father; Heart failure in his maternal grandmother. Allergies  Allergen Reactions  . Nsaids Rash    Takes ASA at home      Outpatient Encounter Medications as of 01/16/2018  Medication Sig  . acetaminophen (TYLENOL) 325 MG tablet Take 325 mg by mouth every 6 (six)  hours as needed. For headache  . aspirin EC 81 MG tablet Take 81 mg by mouth 2 (two) times daily.   . Cholecalciferol (VITAMIN D) 2000 UNITS tablet Take 4,000 Units by mouth daily.  Marland Kitchen L-Arginine 500 MG TABS Take 1,000 mg by mouth daily.  . nitroGLYCERIN (NITROSTAT) 0.4 MG SL tablet Place 0.4 mg under the tongue every 5 (five) minutes as needed. For chest pain  . sildenafil (VIAGRA) 100 MG tablet Take 100 mg by mouth daily as needed.  . [DISCONTINUED] carvedilol (COREG) 12.5 MG tablet Take 12.5 mg by mouth 2 (two) times daily with a meal.  . [DISCONTINUED] escitalopram (LEXAPRO) 10 MG tablet Take 10 mg by mouth daily.  . [DISCONTINUED] fish oil-omega-3 fatty acids 1000 MG capsule Take 1 g by mouth daily.  . [DISCONTINUED] valsartan (DIOVAN) 160 MG tablet Take 160 mg by mouth daily.   No facility-administered encounter medications on file as of 01/16/2018.      REVIEW OF SYSTEMS  : All other systems reviewed and negative except where noted in the History of Present Illness.   PHYSICAL EXAM: Ht 5' 7.5" (1.715 m)   Wt 181 lb 2 oz (82.2 kg)   BMI 27.95 kg/m  General: Well developed white male in no acute distress Head: Normocephalic and atraumatic Eyes:  Sclerae anicteric, conjunctiva pink. Ears: Normal auditory acuity Lungs: Clear throughout to auscultation; no increased WOB. Heart: Regular rate and rhythm; no M/R/G. Abdomen: Soft, non-distended.  BS present.  Non-tender. Rectal:  Will be done at the time of colonoscopy. Musculoskeletal: Symmetrical with no gross deformities  Skin: No lesions on visible extremities Extremities: No edema  Neurological: Alert oriented x 4, grossly non-focal Psychological:  Alert and cooperative. Normal mood and affect  ASSESSMENT AND PLAN: *Screening colonoscopy:  Never had colonoscopy in the past.  Will need at [facility] hospital due to nighttime oxygen use but otherwise is healthy.  Will schedule with Dr. Ardis Hughs.  ? Timing or availability per Dr.  Ardis Hughs. *Epigatric pain:  CT scan showed chronic pancreatitis but interestingly he's never had acute pancreatitis in the past.  Does have gallstones.  ? If he should see surgery about cholecystectomy.  It also showed some possible inflammatory changes of unknown significance in the gastrohepatic and hepatic duodenal ligaments.  Recommended repeat CT scan in one month.  Will plan for this to re-evaluate that area.  Amylase, lipase, and LFT's were normal.  **The risks, benefits, and alternatives were discussed with the patient and they consent to proceed.   CC:  Tamsen Roers, MD

## 2018-01-17 ENCOUNTER — Other Ambulatory Visit: Payer: Self-pay | Admitting: Emergency Medicine

## 2018-01-17 NOTE — Progress Notes (Signed)
Anderson Malta, Very abnormal pancreas on CT 8/14.  Clear calcifications, dilated and ectatic main pancreatic duct.  Before scheduling colon cancer screening colonoscopy I would like to see how interval CT looks.  I see it is scheduled for 9/10 but I prefer it a bit further out to get longer interval to allow any inflammation to resolve.  Can it be rescheduled for the last week in September, pancreatic protocol and we'll go from there.  Thanks

## 2018-01-30 ENCOUNTER — Inpatient Hospital Stay: Admission: RE | Admit: 2018-01-30 | Payer: Medicare Other | Source: Ambulatory Visit

## 2018-02-07 ENCOUNTER — Other Ambulatory Visit (INDEPENDENT_AMBULATORY_CARE_PROVIDER_SITE_OTHER): Payer: Medicare Other

## 2018-02-07 DIAGNOSIS — R1013 Epigastric pain: Secondary | ICD-10-CM

## 2018-02-07 DIAGNOSIS — Z1211 Encounter for screening for malignant neoplasm of colon: Secondary | ICD-10-CM

## 2018-02-07 DIAGNOSIS — Z23 Encounter for immunization: Secondary | ICD-10-CM | POA: Diagnosis not present

## 2018-02-07 LAB — BASIC METABOLIC PANEL
BUN: 10 mg/dL (ref 6–23)
CO2: 29 mEq/L (ref 19–32)
CREATININE: 0.93 mg/dL (ref 0.40–1.50)
Calcium: 9.6 mg/dL (ref 8.4–10.5)
Chloride: 100 mEq/L (ref 96–112)
GFR: 85.23 mL/min (ref >60.00)
Glucose, Bld: 102 mg/dL — ABNORMAL HIGH (ref 70–99)
Potassium: 3.8 mEq/L (ref 3.5–5.1)
Sodium: 136 mEq/L (ref 135–145)

## 2018-02-19 ENCOUNTER — Ambulatory Visit (INDEPENDENT_AMBULATORY_CARE_PROVIDER_SITE_OTHER)
Admission: RE | Admit: 2018-02-19 | Discharge: 2018-02-19 | Disposition: A | Discharge status: Home / Self Care | Payer: Medicare Other | Source: Ambulatory Visit | Attending: Gastroenterology | Admitting: Gastroenterology

## 2018-02-19 DIAGNOSIS — R1013 Epigastric pain: Secondary | ICD-10-CM | POA: Diagnosis not present

## 2018-02-19 DIAGNOSIS — Z1211 Encounter for screening for malignant neoplasm of colon: Secondary | ICD-10-CM | POA: Diagnosis not present

## 2018-02-19 DIAGNOSIS — K859 Acute pancreatitis without necrosis or infection, unspecified: Secondary | ICD-10-CM | POA: Diagnosis not present

## 2018-02-19 MED ORDER — IOPAMIDOL (ISOVUE-300) INJECTION 61%
100.0000 mL | Freq: Once | INTRAVENOUS | Status: AC | PRN
  Administered 2018-02-19: 100 mL via INTRAVENOUS

## 2018-02-26 ENCOUNTER — Telehealth: Payer: Self-pay | Admitting: Internal Medicine

## 2018-02-28 ENCOUNTER — Encounter: Payer: Medicare Other | Admitting: Gastroenterology

## 2018-02-28 NOTE — Telephone Encounter (Signed)
Understand.  No charge. 

## 2018-02-28 NOTE — Telephone Encounter (Signed)
FYI

## 2018-07-02 ENCOUNTER — Encounter: Payer: Self-pay | Admitting: Gastroenterology

## 2018-10-10 ENCOUNTER — Ambulatory Visit: Payer: Self-pay | Admitting: Cardiology

## 2018-12-04 DIAGNOSIS — N529 Male erectile dysfunction, unspecified: Secondary | ICD-10-CM | POA: Diagnosis not present

## 2018-12-04 DIAGNOSIS — E291 Testicular hypofunction: Secondary | ICD-10-CM | POA: Diagnosis not present

## 2019-01-14 DIAGNOSIS — E782 Mixed hyperlipidemia: Secondary | ICD-10-CM | POA: Diagnosis not present

## 2019-01-14 DIAGNOSIS — E291 Testicular hypofunction: Secondary | ICD-10-CM | POA: Diagnosis not present

## 2019-01-14 DIAGNOSIS — I10 Essential (primary) hypertension: Secondary | ICD-10-CM | POA: Diagnosis not present

## 2019-01-14 DIAGNOSIS — Z0001 Encounter for general adult medical examination with abnormal findings: Secondary | ICD-10-CM | POA: Diagnosis not present

## 2019-01-14 DIAGNOSIS — I259 Chronic ischemic heart disease, unspecified: Secondary | ICD-10-CM | POA: Diagnosis not present

## 2019-02-21 DIAGNOSIS — Z23 Encounter for immunization: Secondary | ICD-10-CM | POA: Diagnosis not present

## 2019-07-09 ENCOUNTER — Encounter: Payer: Self-pay | Admitting: Cardiology

## 2019-07-09 ENCOUNTER — Other Ambulatory Visit: Payer: Self-pay

## 2019-07-09 ENCOUNTER — Ambulatory Visit (INDEPENDENT_AMBULATORY_CARE_PROVIDER_SITE_OTHER): Payer: Medicare Other | Admitting: Cardiology

## 2019-07-09 VITALS — BP 128/88 | HR 91 | Temp 98.1°F | Ht 65.0 in | Wt 181.0 lb

## 2019-07-09 DIAGNOSIS — I10 Essential (primary) hypertension: Secondary | ICD-10-CM | POA: Diagnosis not present

## 2019-07-09 DIAGNOSIS — G4734 Idiopathic sleep related nonobstructive alveolar hypoventilation: Secondary | ICD-10-CM

## 2019-07-09 DIAGNOSIS — I77811 Abdominal aortic ectasia: Secondary | ICD-10-CM

## 2019-07-09 DIAGNOSIS — I2510 Atherosclerotic heart disease of native coronary artery without angina pectoris: Secondary | ICD-10-CM | POA: Diagnosis not present

## 2019-07-09 NOTE — Progress Notes (Signed)
Primary Physician/Referring:  Tamsen Roers, MD  Patient ID: Taylor Lyons, male    DOB: [birth year], 74 y.o.   MRN: [id number]  Chief Complaint  Patient presents with  . Coronary Artery Disease  . Hypertension  . Follow-up    1 year   HPI:    Taylor Lyons  is a 74 y.o. 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 No chest pain, has shortness of breath and decreased exercise tolerence that has remained stable. He denies PND or orthopnea. No syncope, no palpitation. He does not want to be on statins.  Past Medical History:  Diagnosis Date  . Coronary artery disease   . Essential hypertension, benign   . Family history of ischemic heart disease   . Old MI (myocardial infarction)    "never knew it"  . Other and unspecified angina pectoris   . Pure hyperglyceridemia   . Shortness of breath    '" MILD "   Past Surgical History:  Procedure Laterality Date  . CARDIAC CATHETERIZATION  07/25/11   "1; first time ever"  . CAROTID ARTERY ANGIOPLASTY    . CORONARY ANGIOPLASTY WITH STENT PLACEMENT    . LEFT HEART CATHETERIZATION WITH CORONARY ANGIOGRAM N/A 07/19/2011   Procedure: LEFT HEART CATHETERIZATION WITH CORONARY ANGIOGRAM;  Surgeon: Laverda Page, MD;  Location: [facility] CATH LAB;  Service: Cardiovascular;  Laterality: N/A;  . LEFT HEART CATHETERIZATION WITH CORONARY ANGIOGRAM N/A 03/13/2012   Procedure: LEFT HEART CATHETERIZATION WITH CORONARY ANGIOGRAM;  Surgeon: Laverda Page, MD;  Location: [facility] CATH LAB;  Service: Cardiovascular;  Laterality: N/A;  . PERCUTANEOUS CORONARY STENT INTERVENTION (PCI-S) N/A 07/25/2011   Procedure: PERCUTANEOUS CORONARY STENT INTERVENTION (PCI-S);  Surgeon: Laverda Page, MD;  Location: [facility] CATH LAB;  Service: Cardiovascular;  Laterality: N/A;  .  PERCUTANEOUS CORONARY STENT INTERVENTION (PCI-S) Right 03/13/2012   Procedure: PERCUTANEOUS CORONARY STENT INTERVENTION (PCI-S);  Surgeon: Laverda Page, MD;  Location: [facility] CATH LAB;  Service: Cardiovascular;  Laterality: Right;  . pulmonary fibrosis  02/2013   Both-idiopathic   Social History   Tobacco Use  . Smoking status: Never Smoker  . Smokeless tobacco: Former Systems developer    Types: Snuff  Substance Use Topics  . Alcohol use: Yes    Alcohol/week: 21.0 standard drinks    Types: 21 Glasses of wine per week    Comment: daily wine   Family History  Problem Relation Age of Onset  . Coronary artery disease Father 61       died with MI  . Allergies Mother   . Heart failure Maternal Grandmother   . Cancer Paternal Grandmother   . Colon cancer Neg Hx   . Esophageal cancer Neg Hx   . Rectal cancer Neg Hx     ROS  Review of Systems  Cardiovascular: Negative for dyspnea on exertion and leg swelling.  Gastrointestinal: Negative for melena.   Objective  Blood pressure 128/88, pulse 91, temperature 98.1 F (36.7 C), height 5\' 5"  (1.651 m), weight 181 lb (82.1 kg), SpO2 95 %.  Vitals with BMI 07/09/2019 01/16/2018 12/01/2015  Height 5\' 5"  5' 7.5" 5' 7.5"  Weight 181 lbs 181 lbs 2 oz 202 lbs  BMI 30.12 [id number]  Systolic 0000000 0000000 [id number]  Diastolic 88 68 94  Pulse 91 102 102  Physical Exam  Constitutional: He is oriented to person, place, and time. Vital signs are normal. He appears well-developed and well-nourished.  Mildly obese  HENT:  Head: Atraumatic.  Cardiovascular: Normal rate, regular rhythm, normal heart sounds and intact distal pulses.  Pulmonary/Chest: Effort normal. No accessory muscle usage. No respiratory distress. He has rales (ilateral coarse leathery crackles).  Abdominal: Soft. Bowel sounds are normal.  Neurological: He is oriented to person, place, and time.  Vitals reviewed.  Laboratory examination:   No results for input(s): NA, K, CL, CO2, GLUCOSE, BUN,  CREATININE, CALCIUM, GFRNONAA, GFRAA in the last 8760 hours. CrCl cannot be calculated (Patient's most recent lab result is older than the maximum 21 days allowed.).  CMP Latest Ref Rng & Units 02/07/2018 04/09/2012 03/14/2012  Glucose 70 - 99 mg/dL 102(H) 97 107(H)  BUN 6 - 23 mg/dL 10 11 9   Creatinine 0.40 - 1.50 mg/dL 0.93 0.69 0.62  Sodium 135 - 145 mEq/L 136 140 137  Potassium 3.5 - 5.1 mEq/L 3.8 4.2 3.7  Chloride 96 - 112 mEq/L 100 104 104  CO2 19 - 32 mEq/L 29 25 23   Calcium 8.4 - 10.5 mg/dL 9.6 9.4 9.0   CBC Latest Ref Rng & Units 03/14/2012 07/26/2011  WBC 4.0 - 10.5 K/uL 11.4(H) 10.0  Hemoglobin 13.0 - 17.0 g/dL 13.4 14.5  Hematocrit 39.0 - 52.0 % 38.8(L) 42.1  Platelets 150 - 400 K/uL 206 235   Lipid Panel  No results found for: CHOL, TRIG, HDL, CHOLHDL, VLDL, LDLCALC, LDLDIRECT HEMOGLOBIN A1C No results found for: HGBA1C, MPG TSH No results for input(s): TSH in the last 8760 hours.  Medications and allergies   Allergies  Allergen Reactions  . Nsaids Rash    Takes ASA at home     Current Outpatient Medications  Medication Instructions  . acetaminophen (TYLENOL) 325 mg, Every 6 hours PRN  . aspirin EC 81 mg, Oral, 2 times daily  . L-Arginine 1,000 mg, Daily  . nitroGLYCERIN (NITROSTAT) 0.4 mg, Every 5 min PRN  . tadalafil (CIALIS) 5 mg, Oral, Daily PRN  . Vitamin D 4,000 Units, Daily    Radiology:   CT ABdomen 02/19/2018: 1. Chronic pancreatitis. 2. Previously described ill-defined fluid in the gastrohepatic ligament has resolved. Persistent fat stranding in the gastrohepatic ligament is nonspecific and could represent residua of resolving pancreatitis versus new episode of mild acute pancreatitis. 3. Cholelithiasis. No evidence of acute cholecystitis. No biliary ductal dilatation. 4. Mild left colonic diverticulosis. 5.  Aortic Atherosclerosis (ICD10-I70.0). 6. Coronary atherosclerosis. 7. Stable 2.8 cm ectatic infrarenal abdominal aorta, at risk  for aneurysm development. Recommend follow-up aortic ultrasound in 5 years. This recommendation follows ACR consensus guidelines: White Paper of the ACR Incidental Findings Committee II on Vascular Findings. J Am Coll Radiol 2013; 10:789-794. 8. Spectrum of findings at the lung bases suggestive of fibrotic interstitial lung disease, as detailed on 11/03/2015 high-resolution chest CT study.  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 PTCA and stenting of LM with 3.5x22 Resolute drug eluting stent. 03/13/2012: Repeat cath: Instent restenosis, PTCA of left main stent, PTCA and angoscore balloon angioplasty of a large subtotaled D2, scoring balloon angioplasty of the mid LAD. No stent was implanted in LAD due to diffuse disease.  Echocardiogram 05/29/2013: Left ventricle cavity is normal in size. Moderate concentric hypertrophy of the left ventricle. Moderate global hypokinesis. Left ventricle regional wall motion findings: Cannot exclude mild anterolateral  hypokinesis. Doppler evidence of grade I (impaired) diastolic dysfunction. Visual EF is 40-45%. Calculated EF 36%. Left atrial cavity is mildly dilated. No significant valvular abnormalities. Compared to echo 2013, LVEF decreased from 60%.  Exercise myoview stress 06/20/2014: 1. The resting electrocardiogram demonstrated normal sinus rhythm, normal resting conduction, no resting arrhythmias and normal rest repolarization. The stress electrocardiogram revealed 2 mm of upsloping ST depression in lead (s):II, III, aVF, V4, V5, V6 consistent with myocardial ischemia. ST changes persisted for > 2 mintues into recovery. The stress test was terminated because of dyspnea and achieving THR (91% MPHR). The patient performed treadmill exercise using a Bruce protocol, completing 5 minutes. The patient completed an estimated workload of 7.3 METS. 2. The right ventricle appears to be  mildly dilated. The perfusion imaging study demonstrates a medium-sized moderate perfusion defect involving the anterior wall extending from the mid ventricle towards the apex. This is consistent with prior infarct, minimal amount of peri-infarct ischemia cannot completely exclude. Left ventricle systolic function Calculated by QGS was moderately depressed at 40% with anterolateral hypokinesis. This represents an intermediate risk scan, clinical correlation is recommended.  Nocturnal oximetry 07/04/2014: Time < 88%: 240 minutes, time < 89%: 305 minutes, low SpO2 56%, total desaturation events: 219, average events per hour: 31. This patient qualifies for nocturnal oxygen per Medicare's Group 1.  Assessment     ICD-10-CM   1. Atherosclerosis of native coronary artery of native heart without angina pectoris  I25.10   2. Essential hypertension, benign  I10 EKG 12-Lead  3. Nocturnal hypoxemia  G47.34   4. Abdominal aortic ectasia (HCC)  I77.811     EKG 07/09/2019: Normal sinus rhythm at rate of 94 beats minute, normal axis.  Poor R progression, cannot exclude anteroseptal infarct old.  Be a normal variant.  No evidence of ischemia.      No orders of the defined types were placed in this encounter.   Medications Discontinued During This Encounter  Medication Reason  . sildenafil (VIAGRA) 100 MG tablet Error    Recommendations:   Kori Mckee  is a 74 y.o. 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 No chest pain, has shortness of breath and decreased exercise tolerence that has remained stable. He denies PND or orthopnea. No syncope, no palpitation. He does not want to be on statins.  He has not seen pulmonary medicine for greater than a year and he had also not seen me for most 2 years.  Fortunately  he is doing well without recurrence of angina pectoris.  He does carry nitroglycerin with him and he is aware of the interaction with Cialis that he is taking on a daily basis.  I encouraged him to establish with pulmonary medicine again to follow-up on pulmonary fibrosis.  Reviewed his recently performed CT scan of the abdomen, he now has been diagnosed with chronic pancreatitis.  Abdominal ectasia also noted and he may need repeat abdominal aortic duplex probably in 5 years or so to exclude formation of aneurysm.  Blood pressure is well controlled, he has been compliant with use of oxygen at night.  No changes in the medications were done today.  I again tried to encourage him regarding statins which he attributes pulmonary fibrosis to statins.  We discussed regarding Covid vaccine, he is skeptical about the vaccine.  With regard to hyperlipidemia, states that all his labs have been within normal limits, I was not able to access his PCP labs.  Adrian Prows, MD, [facility] 07/10/2019, 5:18 AM [facility] [place] Office: [phone]

## 2020-07-08 ENCOUNTER — Encounter: Payer: Self-pay | Admitting: Cardiology

## 2020-07-08 ENCOUNTER — Other Ambulatory Visit: Payer: Self-pay

## 2020-07-08 ENCOUNTER — Ambulatory Visit: Payer: Medicare Other | Admitting: Cardiology

## 2020-07-08 VITALS — BP 133/84 | HR 86 | Temp 98.2°F | Resp 16 | Ht 65.0 in | Wt 167.0 lb

## 2020-07-08 DIAGNOSIS — I10 Essential (primary) hypertension: Secondary | ICD-10-CM

## 2020-07-08 DIAGNOSIS — J849 Interstitial pulmonary disease, unspecified: Secondary | ICD-10-CM

## 2020-07-08 DIAGNOSIS — I2510 Atherosclerotic heart disease of native coronary artery without angina pectoris: Secondary | ICD-10-CM

## 2020-07-08 DIAGNOSIS — G4734 Idiopathic sleep related nonobstructive alveolar hypoventilation: Secondary | ICD-10-CM

## 2020-07-08 NOTE — Progress Notes (Signed)
Primary Physician/Referring:  No primary care provider on file.  Patient ID: Taylor Lyons, male    DOB: 04-Oct-1945, 75 y.o.   MRN: [id number]  Chief Complaint  Patient presents with  . Coronary Artery Disease  . Follow-up    1 year    HPI:    Taylor Lyons  is a 75 y.o. 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 No chest pain, has shortness of breath and decreased exercise tolerence that has remained stable. He denies PND or orthopnea. No syncope, no palpitation. He does not want to be on statins.  He has now relocated to [place], [place] and plans to establish with cardiology and PCP there.  He has no specific complaints today.  He is also planning to set up complete physical examination soon.  Past Medical History:  Diagnosis Date  . Coronary artery disease   . Essential hypertension, benign   . Family history of ischemic heart disease   . Old MI (myocardial infarction)    "never knew it"  . Other and unspecified angina pectoris   . Pure hyperglyceridemia   . Shortness of breath    '" MILD "   Past Surgical History:  Procedure Laterality Date  . CARDIAC CATHETERIZATION  07/25/11   "1; first time ever"  . CAROTID ARTERY ANGIOPLASTY    . CORONARY ANGIOPLASTY WITH STENT PLACEMENT    . LEFT HEART CATHETERIZATION WITH CORONARY ANGIOGRAM N/A 07/19/2011   Procedure: LEFT HEART CATHETERIZATION WITH CORONARY ANGIOGRAM;  Surgeon: Laverda Page, MD;  Location: [facility] CATH LAB;  Service: Cardiovascular;  Laterality: N/A;  . LEFT HEART CATHETERIZATION WITH CORONARY ANGIOGRAM N/A 03/13/2012   Procedure: LEFT HEART CATHETERIZATION WITH CORONARY ANGIOGRAM;  Surgeon: Laverda Page, MD;  Location: [facility] CATH LAB;  Service: Cardiovascular;  Laterality: N/A;  . PERCUTANEOUS CORONARY STENT INTERVENTION  (PCI-S) N/A 07/25/2011   Procedure: PERCUTANEOUS CORONARY STENT INTERVENTION (PCI-S);  Surgeon: Laverda Page, MD;  Location: [facility] CATH LAB;  Service: Cardiovascular;  Laterality: N/A;  . PERCUTANEOUS CORONARY STENT INTERVENTION (PCI-S) Right 03/13/2012   Procedure: PERCUTANEOUS CORONARY STENT INTERVENTION (PCI-S);  Surgeon: Laverda Page, MD;  Location: [facility] CATH LAB;  Service: Cardiovascular;  Laterality: Right;  . pulmonary fibrosis  02/2013   Both-idiopathic   Social History   Tobacco Use  . Smoking status: Never Smoker  . Smokeless tobacco: Former Systems developer    Types: Snuff  Substance Use Topics  . Alcohol use: Yes    Alcohol/week: 21.0 standard drinks    Types: 21 Glasses of wine per week    Comment: daily wine   Family History  Problem Relation Age of Onset  . Coronary artery disease Father 81       died with MI  . Allergies Mother   . Heart failure Maternal Grandmother   . Cancer Paternal Grandmother   . Colon cancer Neg Hx   . Esophageal cancer Neg Hx   . Rectal cancer Neg Hx     ROS  Review of Systems  Cardiovascular: Negative for dyspnea on exertion and leg swelling.  Gastrointestinal: Negative for melena.   Objective  Blood pressure 133/84, pulse 86, temperature 98.2 F (36.8 C), temperature source Temporal, resp. rate 16, height 5\' 5"  (1.651 m), weight 167 lb (75.8 kg), SpO2 98 %.  Vitals with  BMI 07/08/2020 07/09/2019 01/16/2018  Height 5\' 5"  5\' 5"  5' 7.5"  Weight 167 lbs 181 lbs 181 lbs 2 oz  BMI 27.79 43.15 40.08  Systolic 676 195 093  Diastolic 84 88 68  Pulse 86 91 102     Physical Exam Vitals reviewed.  Constitutional:      Appearance: He is well-developed.     Comments: Mildly obese  HENT:     Head: Atraumatic.  Cardiovascular:     Rate and Rhythm: Normal rate and regular rhythm.     Pulses: Intact distal pulses.     Heart sounds: Normal heart sounds.  Pulmonary:     Effort: Pulmonary effort is normal. No accessory muscle usage or respiratory  distress.     Breath sounds: Rales (ilateral coarse leathery crackles) present.  Abdominal:     General: Bowel sounds are normal.     Palpations: Abdomen is soft.  Neurological:     Mental Status: He is oriented to person, place, and time.    Laboratory examination:   No results for input(s): NA, K, CL, CO2, GLUCOSE, BUN, CREATININE, CALCIUM, GFRNONAA, GFRAA in the last 8760 hours. CrCl cannot be calculated (Patient's most recent lab result is older than the maximum 21 days allowed.).  CMP Latest Ref Rng & Units 02/07/2018 04/09/2012 03/14/2012  Glucose 70 - 99 mg/dL 102(H) 97 107(H)  BUN 6 - 23 mg/dL 10 11 9   Creatinine 0.40 - 1.50 mg/dL 0.93 0.69 0.62  Sodium 135 - 145 mEq/L 136 140 137  Potassium 3.5 - 5.1 mEq/L 3.8 4.2 3.7  Chloride 96 - 112 mEq/L 100 104 104  CO2 19 - 32 mEq/L 29 25 23   Calcium 8.4 - 10.5 mg/dL 9.6 9.4 9.0   CBC Latest Ref Rng & Units 03/14/2012 07/26/2011  WBC 4.0 - 10.5 K/uL 11.4(H) 10.0  Hemoglobin 13.0 - 17.0 g/dL 13.4 14.5  Hematocrit 39.0 - 52.0 % 38.8(L) 42.1  Platelets 150 - 400 K/uL 206 235   Lipid Panel  No results found for: CHOL, TRIG, HDL, CHOLHDL, VLDL, LDLCALC, LDLDIRECT HEMOGLOBIN A1C No results found for: HGBA1C, MPG TSH No results for input(s): TSH in the last 8760 hours.  Medications and allergies   Allergies  Allergen Reactions  . Nsaids Rash    Takes ASA at home    Current Outpatient Medications on File Prior to Visit  Medication Sig Dispense Refill  . acetaminophen (TYLENOL) 325 MG tablet Take 325 mg by mouth every 6 (six) hours as needed. For headache    . aspirin EC 81 MG tablet Take 81 mg by mouth 2 (two) times daily.     . Cholecalciferol (VITAMIN D) 2000 UNITS tablet Take 4,000 Units by mouth daily.    Marland Kitchen L-Arginine 500 MG TABS Take 1,000 mg by mouth daily.    . nitroGLYCERIN (NITROSTAT) 0.4 MG SL tablet Place 0.4 mg under the tongue every 5 (five) minutes as needed. For chest pain    . tadalafil (CIALIS) 5 MG tablet Take  5 mg by mouth daily as needed for erectile dysfunction.     No current facility-administered medications on file prior to visit.     Radiology:   CT ABdomen 02/19/2018: 1. Chronic pancreatitis. 2. Previously described ill-defined fluid in the gastrohepatic ligament has resolved. Persistent fat stranding in the gastrohepatic ligament is nonspecific and could represent residua of resolving pancreatitis versus new episode of mild acute pancreatitis. 3. Cholelithiasis. No evidence of acute cholecystitis. No biliary ductal dilatation. 4. Mild  left colonic diverticulosis. 5.  Aortic Atherosclerosis (ICD10-I70.0). 6. Coronary atherosclerosis. 7. 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 Recommend follow-up aortic ultrasound in 5 years. This recommendation follows ACR consensus guidelines: White Paper of the ACR Incidental Findings Committee II on Vascular Findings. J Am Coll Radiol 2013; 10:789-794. 8. Spectrum of findings at the lung bases suggestive of fibrotic interstitial lung disease, as detailed on 11/03/2015 high-resolution chest CT study.  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 PTCA and stenting of LM with 3.5x22 Resolute drug eluting stent. 03/13/2012: Repeat cath: Instent restenosis, PTCA of left main stent, PTCA and angoscore balloon angioplasty of a large subtotaled D2, scoring balloon angioplasty of the mid LAD. No stent was implanted in LAD due to diffuse disease.  Echocardiogram 05/29/2013: Left ventricle cavity is normal in size. Moderate concentric hypertrophy of the left ventricle. Moderate global hypokinesis. Left ventricle regional wall motion findings: Cannot exclude mild anterolateral hypokinesis. Doppler evidence of grade I (impaired) diastolic dysfunction. Visual EF is 40-45%. Calculated EF 36%. Left atrial cavity is mildly dilated. No significant  valvular abnormalities. Compared to echo 2013, LVEF decreased from 60%.  Exercise myoview stress 06/20/2014: 1. The resting electrocardiogram demonstrated normal sinus rhythm, normal resting conduction, no resting arrhythmias and normal rest repolarization. The stress electrocardiogram revealed 2 mm of upsloping ST depression in lead (s):II, III, aVF, V4, V5, V6 consistent with myocardial ischemia. ST changes persisted for > 2 mintues into recovery. The stress test was terminated because of dyspnea and achieving THR (91% MPHR). The patient performed treadmill exercise using a Bruce protocol, completing 5 minutes. The patient completed an estimated workload of 7.3 METS. 2. The right ventricle appears to be mildly dilated. The perfusion imaging study demonstrates a medium-sized moderate perfusion defect involving the anterior wall extending from the mid ventricle towards the apex. This is consistent with prior infarct, minimal amount of peri-infarct ischemia cannot completely exclude. Left ventricle systolic function Calculated by QGS was moderately depressed at 40% with anterolateral hypokinesis. This represents an intermediate risk scan, clinical correlation is recommended.  Nocturnal oximetry 07/04/2014: Time < 88%: 240 minutes, time < 89%: 305 minutes, low SpO2 56%, total desaturation events: 219, average events per hour: 31. This patient qualifies for nocturnal oxygen per Medicare's Group 1.  EKG:   EKG 07/08/2020: Normal sinus rhythm with rate of 85 bpm.  No evidence of ischemia, otherwise normal EKG. EKG 07/09/2019: Normal sinus rhythm at rate of 94 beats minute, normal axis.  Poor R progression, cannot exclude anteroseptal infarct old.  Be a normal variant.  No evidence of ischemia.    Assessment     ICD-10-CM   1. Atherosclerosis of native coronary artery of native heart without angina pectoris  I25.10 EKG 12-Lead  2. Essential hypertension, benign  I10   3. Nocturnal hypoxemia  G47.34   4.  Interstitial lung disease ([place])  J84.9     No orders of the defined types were placed in this encounter.  There are no discontinued medications.  Orders Placed This Encounter  Procedures  . EKG 12-Lead    Recommendations:   Quintel Mccalla  is a 75 y.o. 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 No chest pain, has shortness of  breath and decreased exercise tolerence that has remained stable. He denies PND or orthopnea. No syncope, no palpitation. He does not want to be on statins.  He last saw pulmonary medicine about 3 years ago.  Fortunately he is doing well without recurrence of angina pectoris.   He has mild abdominal aortic ectasia and will need repeat ultrasound of the abdomen in probably 3 to 5 years from now.  He also has chronic pancreatitis but asymptomatic from this.  Blood pressure is well controlled, he has been compliant with use of oxygen at night for severe nocturnal hypoxemia.  No changes in the medications were done today.   He does not like to be on statins and like to continue "natural medicines".  He has now moved to [place], [place] and he will establish with a cardiologist and PCP there soon.  I will see him back on a as needed basis.   Adrian Prows, MD, [facility] 07/08/2020, 11:23 AM Office: [phone] Pager: [phone]

## 2020-12-21 DEATH — deceased
